# Patient Record
Sex: Female | Born: 1980 | Race: White | Hispanic: No | Marital: Married | Smoking: Former smoker
Health system: Southern US, Community
[De-identification: ages and names within clinical notes are randomized; demographics above are authoritative.]

## PROBLEM LIST (undated history)

## (undated) ENCOUNTER — Inpatient Hospital Stay (HOSPITAL_COMMUNITY): Payer: Self-pay

## (undated) DIAGNOSIS — K219 Gastro-esophageal reflux disease without esophagitis: Secondary | ICD-10-CM

## (undated) DIAGNOSIS — R51 Headache: Secondary | ICD-10-CM

## (undated) DIAGNOSIS — M797 Fibromyalgia: Secondary | ICD-10-CM

## (undated) DIAGNOSIS — K8020 Calculus of gallbladder without cholecystitis without obstruction: Secondary | ICD-10-CM

## (undated) DIAGNOSIS — J45909 Unspecified asthma, uncomplicated: Secondary | ICD-10-CM

## (undated) DIAGNOSIS — G43909 Migraine, unspecified, not intractable, without status migrainosus: Secondary | ICD-10-CM

## (undated) DIAGNOSIS — F419 Anxiety disorder, unspecified: Secondary | ICD-10-CM

## (undated) DIAGNOSIS — F32A Depression, unspecified: Secondary | ICD-10-CM

## (undated) DIAGNOSIS — H4710 Unspecified papilledema: Secondary | ICD-10-CM

## (undated) DIAGNOSIS — R011 Cardiac murmur, unspecified: Secondary | ICD-10-CM

## (undated) DIAGNOSIS — J189 Pneumonia, unspecified organism: Secondary | ICD-10-CM

## (undated) DIAGNOSIS — Z8659 Personal history of other mental and behavioral disorders: Secondary | ICD-10-CM

## (undated) DIAGNOSIS — D649 Anemia, unspecified: Secondary | ICD-10-CM

## (undated) DIAGNOSIS — G932 Benign intracranial hypertension: Secondary | ICD-10-CM

## (undated) DIAGNOSIS — Z8669 Personal history of other diseases of the nervous system and sense organs: Secondary | ICD-10-CM

## (undated) DIAGNOSIS — F329 Major depressive disorder, single episode, unspecified: Secondary | ICD-10-CM

## (undated) HISTORY — DX: Migraine, unspecified, not intractable, without status migrainosus: G43.909

## (undated) HISTORY — DX: Unspecified papilledema: H47.10

## (undated) HISTORY — PX: UPPER GASTROINTESTINAL ENDOSCOPY: SHX188

## (undated) HISTORY — PX: ANKLE RECONSTRUCTION: SHX1151

---

## 2010-06-11 ENCOUNTER — Emergency Department (HOSPITAL_COMMUNITY): Admission: EM | Admit: 2010-06-11 | Discharge: 2010-06-11 | Payer: Self-pay | Admitting: Emergency Medicine

## 2011-04-03 ENCOUNTER — Emergency Department (HOSPITAL_COMMUNITY)
Admission: EM | Admit: 2011-04-03 | Discharge: 2011-04-04 | Disposition: A | Discharge status: Home / Self Care | Payer: BC Managed Care – PPO | Attending: Emergency Medicine | Admitting: Emergency Medicine

## 2011-04-03 DIAGNOSIS — L509 Urticaria, unspecified: Secondary | ICD-10-CM | POA: Insufficient documentation

## 2011-04-03 DIAGNOSIS — B999 Unspecified infectious disease: Secondary | ICD-10-CM | POA: Insufficient documentation

## 2011-04-03 DIAGNOSIS — I889 Nonspecific lymphadenitis, unspecified: Secondary | ICD-10-CM | POA: Insufficient documentation

## 2011-04-03 DIAGNOSIS — M542 Cervicalgia: Secondary | ICD-10-CM | POA: Insufficient documentation

## 2011-04-04 ENCOUNTER — Encounter (HOSPITAL_COMMUNITY): Payer: Self-pay

## 2011-04-04 ENCOUNTER — Emergency Department (HOSPITAL_COMMUNITY): Payer: BC Managed Care – PPO

## 2011-04-04 MED ORDER — IOHEXOL 300 MG/ML  SOLN
100.0000 mL | Freq: Once | INTRAMUSCULAR | Status: AC | PRN
  Administered 2011-04-04: 100 mL via INTRAVENOUS

## 2011-07-11 LAB — OB RESULTS CONSOLE HEPATITIS B SURFACE ANTIGEN: Hepatitis B Surface Ag: NEGATIVE

## 2011-07-11 LAB — OB RESULTS CONSOLE GC/CHLAMYDIA
Chlamydia: NEGATIVE
Gonorrhea: NEGATIVE

## 2011-07-11 LAB — OB RESULTS CONSOLE RUBELLA ANTIBODY, IGM: Rubella: IMMUNE

## 2011-07-11 LAB — OB RESULTS CONSOLE ANTIBODY SCREEN: Antibody Screen: NEGATIVE

## 2011-07-11 LAB — OB RESULTS CONSOLE HIV ANTIBODY (ROUTINE TESTING): HIV: NONREACTIVE
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
## 2012-01-28 ENCOUNTER — Emergency Department (HOSPITAL_COMMUNITY): Payer: BC Managed Care – PPO

## 2012-01-28 ENCOUNTER — Inpatient Hospital Stay (HOSPITAL_COMMUNITY)
Admission: EM | Admit: 2012-01-28 | Discharge: 2012-01-28 | Disposition: A | Discharge status: Home / Self Care | Payer: BC Managed Care – PPO | Source: Ambulatory Visit | Attending: Obstetrics and Gynecology | Admitting: Obstetrics and Gynecology

## 2012-01-28 ENCOUNTER — Inpatient Hospital Stay (HOSPITAL_COMMUNITY): Payer: BC Managed Care – PPO

## 2012-01-28 ENCOUNTER — Encounter (HOSPITAL_COMMUNITY): Payer: Self-pay | Admitting: *Deleted

## 2012-01-28 DIAGNOSIS — M79609 Pain in unspecified limb: Secondary | ICD-10-CM | POA: Insufficient documentation

## 2012-01-28 DIAGNOSIS — Y92410 Unspecified street and highway as the place of occurrence of the external cause: Secondary | ICD-10-CM | POA: Insufficient documentation

## 2012-01-28 DIAGNOSIS — Z3490 Encounter for supervision of normal pregnancy, unspecified, unspecified trimester: Secondary | ICD-10-CM

## 2012-01-28 DIAGNOSIS — O99891 Other specified diseases and conditions complicating pregnancy: Secondary | ICD-10-CM | POA: Insufficient documentation

## 2012-01-28 DIAGNOSIS — M25531 Pain in right wrist: Secondary | ICD-10-CM

## 2012-01-28 DIAGNOSIS — S63509A Unspecified sprain of unspecified wrist, initial encounter: Secondary | ICD-10-CM

## 2012-01-28 HISTORY — DX: Unspecified asthma, uncomplicated: J45.909

## 2012-01-28 MED ORDER — OXYCODONE-ACETAMINOPHEN 5-325 MG PO TABS: 1.0000 | ORAL_TABLET | ORAL | Status: AC | PRN
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
## 2012-01-28 NOTE — Progress Notes (Addendum)
At pt bedside. Pt reports no abd pain, positive fetal movement, reports pants to be damp. [facility] 02/25/12 and PNC with Dr. Tenny Craw. Pt states being G3P0 with no complications with pregnancy. No abd tenderness with palpation or patient complaint.

## 2012-01-28 NOTE — ED Provider Notes (Cosign Needed)
  I performed a history and physical examination of Dana Frost and discussed her management with Dr. Leary Roca.  I agree with the history, physical, assessment, and plan of care, with the following exceptions: None  I was present for the following procedures: None Time Spent in Critical Care of the patient: None Time spent in discussions with the patient and family: 10  Magie Ciampa Corlis Leak, MD 01/28/12 1558  Patient seen by ob rapid response and patient being transferred to women's for further monitoring.   Hilario Quarry, MD 01/28/12 [phone]

## 2012-01-28 NOTE — Progress Notes (Signed)
Report given to MAU regarding pt being transferred after being cleared medically.

## 2012-01-28 NOTE — Progress Notes (Signed)
Updated on pt's ultrasound and FHR tracing, contraction pattern
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
## 2012-01-28 NOTE — Progress Notes (Signed)
Dr. Dareen Piano called and notified of pt and FHR tracing with UCs every 2-5 min. Stated pt needed OB complete US. Which would need to be done at [facility]. So transfer was accepted once pt cleared medically.

## 2012-01-28 NOTE — Progress Notes (Signed)
EFM removed for transport to [facility] via EMS
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
## 2012-01-29 ENCOUNTER — Encounter (HOSPITAL_COMMUNITY): Payer: Self-pay | Admitting: Advanced Practice Midwife

## 2012-02-01 NOTE — ED Provider Notes (Signed)
See my note   Hilario Quarry, MD 02/01/12 [phone]

## 2012-02-09 ENCOUNTER — Inpatient Hospital Stay (HOSPITAL_COMMUNITY)
Admission: AD | Admit: 2012-02-09 | Discharge: 2012-02-11 | DRG: 373 | Disposition: A | Discharge status: Home / Self Care | Payer: BC Managed Care – PPO | Source: Ambulatory Visit | Attending: Obstetrics and Gynecology | Admitting: Obstetrics and Gynecology

## 2012-02-09 ENCOUNTER — Encounter (HOSPITAL_COMMUNITY): Payer: Self-pay | Admitting: Obstetrics and Gynecology

## 2012-02-09 DIAGNOSIS — Z8669 Personal history of other diseases of the nervous system and sense organs: Secondary | ICD-10-CM | POA: Insufficient documentation

## 2012-02-09 DIAGNOSIS — Z3490 Encounter for supervision of normal pregnancy, unspecified, unspecified trimester: Secondary | ICD-10-CM

## 2012-02-09 HISTORY — DX: Personal history of other diseases of the nervous system and sense organs: Z86.69

## 2012-02-09 LAB — CBC
Hemoglobin: 12.4 g/dL (ref 12.0–15.0)
Platelets: 236 10*3/uL (ref 150–400)
RBC: 4.22 MIL/uL (ref 3.87–5.11)
WBC: 8.6 10*3/uL (ref 4.0–10.5)

## 2012-02-09 MED ORDER — ONDANSETRON HCL 4 MG/2ML IJ SOLN: 4.0000 mg | Freq: Four times a day (QID) | INTRAMUSCULAR | Status: DC | PRN

## 2012-02-09 MED ORDER — OXYTOCIN BOLUS FROM INFUSION
250.0000 mL | Freq: Once | INTRAVENOUS | Status: DC
  Filled 2012-02-09: qty 500

## 2012-02-09 MED ORDER — TERBUTALINE SULFATE 1 MG/ML IJ SOLN: 0.2500 mg | Freq: Once | INTRAMUSCULAR | Status: AC | PRN

## 2012-02-09 MED ORDER — OXYCODONE-ACETAMINOPHEN 5-325 MG PO TABS: 1.0000 | ORAL_TABLET | ORAL | Status: DC | PRN

## 2012-02-09 MED ORDER — LACTATED RINGERS IV SOLN: 500.0000 mL | INTRAVENOUS | Status: DC | PRN

## 2012-02-09 MED ORDER — FLEET ENEMA 7-19 GM/118ML RE ENEM: 1.0000 | ENEMA | RECTAL | Status: DC | PRN

## 2012-02-09 MED ORDER — IBUPROFEN 600 MG PO TABS
600.0000 mg | ORAL_TABLET | Freq: Four times a day (QID) | ORAL | Status: DC | PRN
  Administered 2012-02-10: 600 mg via ORAL
  Filled 2012-02-09: qty 1

## 2012-02-09 MED ORDER — OXYTOCIN 40 UNITS IN LACTATED RINGERS INFUSION - SIMPLE MED
1.0000 m[IU]/min | INTRAVENOUS | Status: DC
  Administered 2012-02-09: 2 m[IU]/min via INTRAVENOUS
  Filled 2012-02-09: qty 1000

## 2012-02-09 MED ORDER — ACETAMINOPHEN 325 MG PO TABS: 650.0000 mg | ORAL_TABLET | ORAL | Status: DC | PRN

## 2012-02-09 MED ORDER — LACTATED RINGERS IV SOLN
INTRAVENOUS | Status: DC
  Administered 2012-02-09 – 2012-02-10 (×2): via INTRAVENOUS

## 2012-02-09 MED ORDER — OXYTOCIN 40 UNITS IN LACTATED RINGERS INFUSION - SIMPLE MED
62.5000 mL/h | Freq: Once | INTRAVENOUS | Status: AC
  Administered 2012-02-10: 62.5 mL/h via INTRAVENOUS

## 2012-02-09 MED ORDER — CITRIC ACID-SODIUM CITRATE 334-500 MG/5ML PO SOLN
30.0000 mL | ORAL | Status: DC | PRN
  Administered 2012-02-10: 30 mL via ORAL
  Filled 2012-02-09: qty 15

## 2012-02-09 MED ORDER — LIDOCAINE HCL (PF) 1 % IJ SOLN
30.0000 mL | INTRAMUSCULAR | Status: DC | PRN
  Filled 2012-02-09: qty 30

## 2012-02-09 NOTE — Progress Notes (Signed)
Notified Dr. Claiborne Billings that patient is grossly ruptured. No beds available on L/D- Dr. Claiborne Billings requested NP to do speculum exam due to patients hx of HSV. Kerrie Buffalo NP notified.

## 2012-02-09 NOTE — H&P (Signed)
31 y.o. [redacted]w[redacted]d  G3P0020 comes in c/o SROM about 1:30 pm.  Otherwise has good fetal movement and no bleeding.  No other complaints.  Past Medical History  Diagnosis Date  . Asthma   . HSV (herpes simplex virus) infection     Past Surgical History  Procedure Date  . Upper gastrointestinal endoscopy   . Ankle reconstruction     OB History    Grav Para Term Preterm Abortions TAB SAB Ect Mult Living   3 0 0 0 2 1 1 0 0 0      # Outc Date GA Lbr Len/2nd Wgt Sex Del Anes PTL Lv   1 TAB            2 SAB            3 CUR               History   Social History  . Marital Status: Single    Spouse Name: N/A    Number of Children: N/A  . Years of Education: N/A   Occupational History  . Not on file.   Social History Main Topics  . Smoking status: Former Games developer  . Smokeless tobacco: Not on file  . Alcohol Use: No  . Drug Use: No  . Sexually Active: Not Currently   Other Topics Concern  . Not on file   Social History Narrative  . No narrative on file   Penicillins   Prenatal Course:  HSV 2 - no outbreaks this pregnancy, on Valtrex prophylaxis, exam neg for lesions per MAU  Filed Vitals:   02/09/12 1753  BP: 126/58  Pulse: 88  Temp: 98 F (36.7 C)  Resp: 18     Lungs/Cor:  NAD Abdomen:  soft, gravid Ex:  no cords, erythema SVE:  1/100/-2 FHTs:  120, good STV, NST R Toco:  q1-5   A/P   Admit with SROM  GBS Neg  Recheck 2 hrs, if no change begin pitocin 2x2  Routine orders. Philip Aspen

## 2012-02-09 NOTE — MAU Provider Note (Signed)
Dana Frost is a 31 y.o. female @ [redacted]w[redacted]d gestation who presents to MAU for ROM and contractions.   BP 126/58  Pulse 88  Temp 98 F (36.7 C) (Oral)  Resp 18  Ht 5\' 7"  (1.702 m)  Wt 229 lb (103.874 kg)  BMI 35.87 kg/m2  LMP 03/29/2011   Pelvic exam: external genitalia without lesions, clear watery pooling noted vaginal vault.  Procedures: Slide obtained for RN to do microscopic exam for ferning. She will call the patient's OB with results of slide and to give update on EFM tracing.   Slide is fern positive.

## 2012-02-09 NOTE — Progress Notes (Signed)
Provider made aware of pt status: uterine contraction pattern, FHT tracing and pain level. POC discussed. Will continue to monitor.

## 2012-02-09 NOTE — MAU Note (Signed)
Started leaking fluid around 1:30 8 gushes and a very large gush, patient is wet at present, [redacted]w[redacted]d G1

## 2012-02-10 ENCOUNTER — Encounter (HOSPITAL_COMMUNITY): Payer: Self-pay | Admitting: Anesthesiology

## 2012-02-10 ENCOUNTER — Encounter (HOSPITAL_COMMUNITY): Payer: Self-pay

## 2012-02-10 ENCOUNTER — Inpatient Hospital Stay (HOSPITAL_COMMUNITY): Payer: BC Managed Care – PPO | Admitting: Anesthesiology

## 2012-02-10 LAB — RPR: RPR Ser Ql: NONREACTIVE

## 2012-02-10 LAB — TYPE AND SCREEN: Antibody Screen: NEGATIVE

## 2012-02-10 LAB — ABO/RH: ABO/RH(D): O POS

## 2012-02-10 MED ORDER — SENNOSIDES-DOCUSATE SODIUM 8.6-50 MG PO TABS: 2.0000 | ORAL_TABLET | Freq: Every day | ORAL | Status: DC

## 2012-02-10 MED ORDER — IBUPROFEN 600 MG PO TABS
600.0000 mg | ORAL_TABLET | Freq: Four times a day (QID) | ORAL | Status: DC
  Administered 2012-02-10 – 2012-02-11 (×4): 600 mg via ORAL
  Filled 2012-02-10 (×4): qty 1

## 2012-02-10 MED ORDER — LANOLIN HYDROUS EX OINT: TOPICAL_OINTMENT | CUTANEOUS | Status: DC | PRN

## 2012-02-10 MED ORDER — EPHEDRINE 5 MG/ML INJ: 10.0000 mg | INTRAVENOUS | Status: DC | PRN

## 2012-02-10 MED ORDER — DIBUCAINE 1 % RE OINT: 1.0000 "application " | TOPICAL_OINTMENT | RECTAL | Status: DC | PRN

## 2012-02-10 MED ORDER — PRENATAL MULTIVITAMIN CH
1.0000 | ORAL_TABLET | Freq: Every day | ORAL | Status: DC
  Administered 2012-02-11: 1 via ORAL
  Filled 2012-02-10: qty 1

## 2012-02-10 MED ORDER — BENZOCAINE-MENTHOL 20-0.5 % EX AERO
1.0000 "application " | INHALATION_SPRAY | CUTANEOUS | Status: DC | PRN
  Administered 2012-02-10: 1 via TOPICAL
  Filled 2012-02-10: qty 56

## 2012-02-10 MED ORDER — PRENATAL MULTIVITAMIN CH: 1.0000 | ORAL_TABLET | Freq: Every day | ORAL | Status: DC

## 2012-02-10 MED ORDER — LIDOCAINE HCL (PF) 1 % IJ SOLN
INTRAMUSCULAR | Status: DC | PRN
  Administered 2012-02-10 (×2): 9 mL

## 2012-02-10 MED ORDER — OXYCODONE-ACETAMINOPHEN 5-325 MG PO TABS: 1.0000 | ORAL_TABLET | ORAL | Status: DC | PRN

## 2012-02-10 MED ORDER — PHENYLEPHRINE 40 MCG/ML (10ML) SYRINGE FOR IV PUSH (FOR BLOOD PRESSURE SUPPORT)
80.0000 ug | PREFILLED_SYRINGE | INTRAVENOUS | Status: DC | PRN
  Filled 2012-02-10: qty 5

## 2012-02-10 MED ORDER — ZOLPIDEM TARTRATE 5 MG PO TABS: 5.0000 mg | ORAL_TABLET | Freq: Every evening | ORAL | Status: DC | PRN

## 2012-02-10 MED ORDER — DIPHENHYDRAMINE HCL 50 MG/ML IJ SOLN: 12.5000 mg | INTRAMUSCULAR | Status: DC | PRN

## 2012-02-10 MED ORDER — PHENYLEPHRINE 40 MCG/ML (10ML) SYRINGE FOR IV PUSH (FOR BLOOD PRESSURE SUPPORT): 80.0000 ug | PREFILLED_SYRINGE | INTRAVENOUS | Status: DC | PRN

## 2012-02-10 MED ORDER — LACTATED RINGERS IV SOLN
500.0000 mL | Freq: Once | INTRAVENOUS | Status: AC
  Administered 2012-02-10: 03:00:00 via INTRAVENOUS

## 2012-02-10 MED ORDER — ONDANSETRON HCL 4 MG PO TABS: 4.0000 mg | ORAL_TABLET | ORAL | Status: DC | PRN

## 2012-02-10 MED ORDER — SIMETHICONE 80 MG PO CHEW: 80.0000 mg | CHEWABLE_TABLET | ORAL | Status: DC | PRN

## 2012-02-10 MED ORDER — TETANUS-DIPHTH-ACELL PERTUSSIS 5-2.5-18.5 LF-MCG/0.5 IM SUSP
0.5000 mL | Freq: Once | INTRAMUSCULAR | Status: AC
  Administered 2012-02-11: 0.5 mL via INTRAMUSCULAR
  Filled 2012-02-10: qty 0.5

## 2012-02-10 MED ORDER — EPHEDRINE 5 MG/ML INJ
10.0000 mg | INTRAVENOUS | Status: DC | PRN
  Administered 2012-02-10: 10 mg via INTRAVENOUS
  Filled 2012-02-10: qty 4

## 2012-02-10 MED ORDER — FENTANYL 2.5 MCG/ML BUPIVACAINE 1/10 % EPIDURAL INFUSION (WH - ANES)
14.0000 mL/h | INTRAMUSCULAR | Status: DC
  Administered 2012-02-10 (×2): 14 mL/h via EPIDURAL
  Filled 2012-02-10 (×3): qty 60

## 2012-02-10 MED ORDER — LACTATED RINGERS IV SOLN: INTRAVENOUS | Status: DC

## 2012-02-10 MED ORDER — FENTANYL 2.5 MCG/ML BUPIVACAINE 1/10 % EPIDURAL INFUSION (WH - ANES)
INTRAMUSCULAR | Status: DC | PRN
  Administered 2012-02-10: 13 mL/h via EPIDURAL

## 2012-02-10 MED ORDER — WITCH HAZEL-GLYCERIN EX PADS: 1.0000 "application " | MEDICATED_PAD | CUTANEOUS | Status: DC | PRN

## 2012-02-10 MED ORDER — DIPHENHYDRAMINE HCL 25 MG PO CAPS: 25.0000 mg | ORAL_CAPSULE | Freq: Four times a day (QID) | ORAL | Status: DC | PRN

## 2012-02-10 MED ORDER — ONDANSETRON HCL 4 MG/2ML IJ SOLN: 4.0000 mg | INTRAMUSCULAR | Status: DC | PRN

## 2012-02-10 NOTE — Anesthesia Preprocedure Evaluation (Signed)
Anesthesia Evaluation  Patient identified by MRN, date of birth, ID band Patient awake    Reviewed: Allergy & Precautions, H&P , NPO status , Patient's Chart, lab work & pertinent test results  Airway Mallampati: II TM Distance: >3 FB Neck ROM: full    Dental No notable dental hx.    Pulmonary    Pulmonary exam normal       Cardiovascular negative cardio ROS      Neuro/Psych negative neurological ROS  negative psych ROS   GI/Hepatic negative GI ROS, Neg liver ROS,   Endo/Other  Morbid obesity  Renal/GU negative Renal ROS  negative genitourinary   Musculoskeletal negative musculoskeletal ROS (+)   Abdominal (+) + obese,   Peds negative pediatric ROS (+)  Hematology negative hematology ROS (+)   Anesthesia Other Findings   Reproductive/Obstetrics (+) Pregnancy                           Anesthesia Physical Anesthesia Plan  ASA: III  Anesthesia Plan: Epidural   Post-op Pain Management:    Induction:   Airway Management Planned:   Additional Equipment:   Intra-op Plan:   Post-operative Plan:   Informed Consent: I have reviewed the patients History and Physical, chart, labs and discussed the procedure including the risks, benefits and alternatives for the proposed anesthesia with the patient or authorized representative who has indicated his/her understanding and acceptance.     Plan Discussed with:   Anesthesia Plan Comments:         Anesthesia Quick Evaluation  

## 2012-02-10 NOTE — Progress Notes (Signed)
Delivery of live viable female by Dr Callahan. APGARs 9,9 

## 2012-02-10 NOTE — Anesthesia Procedure Notes (Signed)
Epidural Patient location during procedure: OB Start time: 02/10/2012 3:12 AM End time: 02/10/2012 3:17 AM Reason for block: procedure for pain  Staffing Anesthesiologist: Sandrea Hughs Performed by: anesthesiologist   Preanesthetic Checklist Completed: patient identified, site marked, surgical consent, pre-op evaluation, timeout performed, IV checked, risks and benefits discussed and monitors and equipment checked  Epidural Patient position: sitting Prep: site prepped and draped and DuraPrep Patient monitoring: continuous pulse ox and blood pressure Approach: midline Injection technique: LOR air  Needle:  Needle type: Tuohy  Needle gauge: 17 G Needle length: 9 cm Needle insertion depth: 5 cm cm Catheter type: closed end flexible Catheter size: 19 Gauge Catheter at skin depth: 10 cm Test dose: negative and Other  Assessment Sensory level: T8 Events: blood not aspirated, injection not painful, no injection resistance, negative IV test and no paresthesia

## 2012-02-10 NOTE — Anesthesia Postprocedure Evaluation (Signed)
  Anesthesia Post-op Note  Patient: Dana Frost  Procedure(s) Performed: * No procedures listed *  Patient Location: PACU and Mother/Baby  Anesthesia Type: Epidural  Level of Consciousness: awake, alert  and oriented  Airway and Oxygen Therapy: Patient Spontanous Breathing   Post-op Assessment: Patient's Cardiovascular Status Stable and Respiratory Function Stable  Post-op Vital Signs: stable  Complications: No apparent anesthesia complications

## 2012-02-11 LAB — CBC
Hemoglobin: 9.4 g/dL — ABNORMAL LOW (ref 12.0–15.0)
MCHC: 33.9 g/dL (ref 30.0–36.0)
RDW: 12.6 % (ref 11.5–15.5)

## 2012-02-11 NOTE — Discharge Summary (Signed)
Obstetric Discharge Summary Reason for Admission: onset of labor and rupture of membranes Prenatal Procedures: ultrasound Intrapartum Procedures: spontaneous vaginal delivery Postpartum Procedures: none Complications-Operative and Postpartum: 1st degree vaginal laceration Hemoglobin  Date Value Range Status  02/11/2012 9.4* 12.0 - 15.0 g/dL Final     DELTA CHECK NOTED     REPEATED TO VERIFY     HCT  Date Value Range Status  02/11/2012 27.7* 36.0 - 46.0 % Final    Physical Exam:  General: alert Lochia: appropriate Uterine Fundus: firm  Discharge Diagnoses: Term Pregnancy-delivered  Discharge Information: Date: 02/11/2012 Activity: pelvic rest Diet: routine Medications: PNV and Ibuprofen Condition: improved Instructions: refer to practice specific booklet Discharge to: home Follow-up Information    Follow up with CALLAHAN, SIDNEY, DO in 4 weeks.   Contact information:   [address], [address] [facility] [place] [address] [phone]          Newborn Data: Live born female  Birth Weight: 7 lb 6 oz (3345 g) APGAR: 9, 9  Home with mother.  Dana Frost 02/11/2012, 9:21 AM

## 2013-07-30 NOTE — L&D Delivery Note (Signed)
Patient was C/C/+4 and pushed for 1 minutes with epidural.   NSVD  female infant, Apgars 9,9, weight P.   The patient had no lacerations. Fundus was firm. EBL was expected. Placenta was delivered intact. Vagina was clear.  Baby was vigorous and doing skin to skin with mother.  Makaylee Spielberg A

## 2013-11-26 ENCOUNTER — Emergency Department (HOSPITAL_COMMUNITY)
Admission: EM | Admit: 2013-11-26 | Discharge: 2013-11-26 | Disposition: A | Discharge status: Home / Self Care | Payer: 59 | Attending: Emergency Medicine | Admitting: Emergency Medicine

## 2013-11-26 ENCOUNTER — Emergency Department (HOSPITAL_COMMUNITY): Payer: 59

## 2013-11-26 ENCOUNTER — Encounter (HOSPITAL_COMMUNITY): Payer: Self-pay | Admitting: Emergency Medicine

## 2013-11-26 DIAGNOSIS — Z79899 Other long term (current) drug therapy: Secondary | ICD-10-CM | POA: Insufficient documentation

## 2013-11-26 DIAGNOSIS — S93609A Unspecified sprain of unspecified foot, initial encounter: Secondary | ICD-10-CM | POA: Insufficient documentation

## 2013-11-26 DIAGNOSIS — O9989 Other specified diseases and conditions complicating pregnancy, childbirth and the puerperium: Secondary | ICD-10-CM | POA: Insufficient documentation

## 2013-11-26 DIAGNOSIS — Z8619 Personal history of other infectious and parasitic diseases: Secondary | ICD-10-CM | POA: Insufficient documentation

## 2013-11-26 DIAGNOSIS — Z880 Allergy status to penicillin: Secondary | ICD-10-CM | POA: Insufficient documentation

## 2013-11-26 DIAGNOSIS — J45909 Unspecified asthma, uncomplicated: Secondary | ICD-10-CM | POA: Insufficient documentation

## 2013-11-26 DIAGNOSIS — Z87891 Personal history of nicotine dependence: Secondary | ICD-10-CM | POA: Insufficient documentation

## 2013-11-26 DIAGNOSIS — S93601A Unspecified sprain of right foot, initial encounter: Secondary | ICD-10-CM

## 2013-11-26 DIAGNOSIS — Y9289 Other specified places as the place of occurrence of the external cause: Secondary | ICD-10-CM | POA: Insufficient documentation

## 2013-11-26 DIAGNOSIS — H9190 Unspecified hearing loss, unspecified ear: Secondary | ICD-10-CM | POA: Insufficient documentation

## 2013-11-26 DIAGNOSIS — X500XXA Overexertion from strenuous movement or load, initial encounter: Secondary | ICD-10-CM | POA: Insufficient documentation

## 2013-11-26 DIAGNOSIS — Y939 Activity, unspecified: Secondary | ICD-10-CM | POA: Insufficient documentation

## 2013-11-26 MED ORDER — ACETAMINOPHEN 325 MG PO TABS
650.0000 mg | ORAL_TABLET | Freq: Once | ORAL | Status: AC
  Administered 2013-11-26: 650 mg via ORAL
  Filled 2013-11-26: qty 2

## 2013-11-26 NOTE — ED Provider Notes (Signed)
CSN: [id number]     Arrival date & time 11/26/13  0801 History   First MD Initiated Contact with Patient 11/26/13 0805     Chief Complaint  Patient presents with  . Foot Pain  . Ankle Pain     (Consider location/radiation/quality/duration/timing/severity/associated sxs/prior Treatment) HPI Patient is a [age] year old female [redacted] weeks pregnant, complaining of right foot pain after twisting ankle in parking lot just prior to arrival. Reports hearing a crack in her foot. Patient complains of constant aching right foot 6/10 pain.  Pain is worse with weightbearing and ambulation.  Denies other injuries. Denies taking pain medication prior to her arrival.  Past Medical History  Diagnosis Date  . Asthma   . HSV (herpes simplex virus) infection   . H/O hearing loss     90%left, 10%right   Past Surgical History  Procedure Laterality Date  . Upper gastrointestinal endoscopy    . Ankle reconstruction     No family history on file. History  Substance Use Topics  . Smoking status: Former Games developermoker  . Smokeless tobacco: Not on file  . Alcohol Use: No   OB History   Grav Para Term Preterm Abortions TAB SAB Ect Mult Living   4 1 1  0 2 1 1  0 0 1     Review of Systems  Musculoskeletal: Positive for arthralgias and myalgias. Negative for joint swelling.       Right foot  Skin: Negative for color change and wound.  Neurological: Negative for weakness and numbness.  All other systems reviewed and are negative.     Allergies  Penicillins  Home Medications   Prior to Admission medications   Medication Sig Start Date End Date Taking? Authorizing Provider  calcium carbonate (TUMS - DOSED IN MG ELEMENTAL CALCIUM) 500 MG chewable tablet Chew 1 tablet by mouth daily. For heartburn    Historical Provider, MD  diphenhydrAMINE (BENADRYL) 25 MG tablet Take 25 mg by mouth at bedtime as needed. Itching    Historical Provider, MD  guaiFENesin (ROBITUSSIN) 100 MG/5ML SOLN Take 10 mLs by mouth every 4  (four) hours as needed. For cough and cold symptoms    Historical Provider, MD  Prenatal Vit-Fe Fumarate-FA (PRENATAL MULTIVITAMIN) TABS Take 1 tablet by mouth daily.    Historical Provider, MD   BP 123/77  Pulse 104  Temp(Src) 98.2 F (36.8 C) (Oral)  Resp 18  Ht 5\' 7"  (1.702 m)  Wt 238 lb (107.956 kg)  BMI 37.27 kg/m2  SpO2 97% Physical Exam  Nursing note and vitals reviewed. Constitutional: She is oriented to person, place, and time. She appears well-developed and well-nourished.  HENT:  Head: Normocephalic and atraumatic.  Eyes: EOM are normal.  Neck: Normal range of motion.  Cardiovascular: Normal rate.   Pulses:      Dorsalis pedis pulses are 2+ on the right side.       Posterior tibial pulses are 2+ on the right side.  Pulmonary/Chest: Effort normal.  Musculoskeletal: She exhibits tenderness. She exhibits no edema.  Tenderness to dorsal and lateral aspect of right foot. No edema or ecchymosis. Decreased ROM due to pain.  FROM all 5 toes.   Neurological: She is alert and oriented to person, place, and time.  Sensation to light and sharp touch in tact  Skin: Skin is warm and dry.  Skin in tact. No ecchymosis, erythema, or warmth. No red streaking, induration, or evidence of underlying infection.  Psychiatric: She has a normal mood and affect. Her  behavior is normal.    ED Course  Procedures (including critical care time) Labs Review Labs Reviewed - No data to display  Imaging Review Dg Ankle Complete Right  11/26/2013   CLINICAL DATA:  Injury, lateral pain  EXAM: RIGHT ANKLE - COMPLETE 3+ VIEW  COMPARISON:  11/26/2013  FINDINGS: There is no evidence of fracture, dislocation, or joint effusion. There is no evidence of arthropathy or other focal bone abnormality. Soft tissues are unremarkable.  IMPRESSION: No acute osseous finding   Electronically Signed   By: Ruel Favorsrevor  Shick M.D.   On: 11/26/2013 08:46   Dg Foot Complete Right  11/26/2013   CLINICAL DATA:  Fall, injury,  pain  EXAM: RIGHT FOOT COMPLETE - 3+ VIEW  COMPARISON:  None.  FINDINGS: There is no evidence of fracture or dislocation. There is no evidence of arthropathy or other focal bone abnormality. Soft tissues are unremarkable.  IMPRESSION: No acute osseous finding   Electronically Signed   By: Ruel Favorsrevor  Shick M.D.   On: 11/26/2013 08:49     EKG Interpretation None      MDM   Final diagnoses:  Right foot sprain    Patient complaining of right pain. Right foot is neurovascularly intact. Plain films of the foot and ankle negative for fracture or other acute findings. Will treat as sprained foot.  We'll provide Ace wrap and crutches. Patient may take acetaminophen discussed RICE treatment. Provided patient information packet. Advise followup with family doctor as well as orthopedics in one to 2 weeks if symptoms not improving. Patient verbalized understanding and agreement with treatment plan    Junius Finnerrin O'Malley, PA-C 11/26/13 [id number]

## 2013-11-26 NOTE — ED Provider Notes (Signed)
Medical screening examination/treatment/procedure(s) were performed by non-physician practitioner and as supervising physician I was immediately available for consultation/collaboration.   EKG Interpretation None        Glynn OctaveStephen Alycen Mack, MD 11/26/13 1535

## 2013-11-26 NOTE — ED Notes (Addendum)
Patient states she tripped on a hole in a parking lot and rolled her foot and ankle.  Patient described as "I heard cracking".   Patient advised she is [redacted] weeks pregnant.

## 2013-11-26 NOTE — Discharge Instructions (Signed)
Foot Sprain The muscles and cord like structures which attach muscle to bone (tendons) that surround the feet are made up of units. A foot sprain can occur at the weakest spot in any of these units. This condition is most often caused by injury to or overuse of the foot, as from playing contact sports, or aggravating a previous injury, or from poor conditioning, or obesity. SYMPTOMS  Pain with movement of the foot.  Tenderness and swelling at the injury site.  Loss of strength is present in moderate or severe sprains. THE THREE GRADES OR SEVERITY OF FOOT SPRAIN ARE:  Mild (Grade I): Slightly pulled muscle without tearing of muscle or tendon fibers or loss of strength.  Moderate (Grade II): Tearing of fibers in a muscle, tendon, or at the attachment to bone, with small decrease in strength.  Severe (Grade III): Rupture of the muscle-tendon-bone attachment, with separation of fibers. Severe sprain requires surgical repair. Often repeating (chronic) sprains are caused by overuse. Sudden (acute) sprains are caused by direct injury or over-use. DIAGNOSIS  Diagnosis of this condition is usually by your own observation. If problems continue, a caregiver may be required for further evaluation and treatment. X-rays may be required to make sure there are not breaks in the bones (fractures) present. Continued problems may require physical therapy for treatment. PREVENTION  Use strength and conditioning exercises appropriate for your sport.  Warm up properly prior to working out.  Use athletic shoes that are made for the sport you are participating in.  Allow adequate time for healing. Early return to activities makes repeat injury more likely, and can lead to an unstable arthritic foot that can result in prolonged disability. Mild sprains generally heal in 3 to 10 days, with moderate and severe sprains taking 2 to 10 weeks. Your caregiver can help you determine the proper time required for  healing. HOME CARE INSTRUCTIONS   Apply ice to the injury for 15-20 minutes, 03-04 times per day. Put the ice in a plastic bag and place a towel between the bag of ice and your skin.  An elastic wrap (like an Ace bandage) may be used to keep swelling down.  Keep foot above the level of the heart, or at least raised on a footstool, when swelling and pain are present.  Try to avoid use other than gentle range of motion while the foot is painful. Do not resume use until instructed by your caregiver. Then begin use gradually, not increasing use to the point of pain. If pain does develop, decrease use and continue the above measures, gradually increasing activities that do not cause discomfort, until you gradually achieve normal use.  Use crutches if and as instructed, and for the length of time instructed.  Keep injured foot and ankle wrapped between treatments.  Massage foot and ankle for comfort and to keep swelling down. Massage from the toes up towards the knee.  Only take over-the-counter or prescription medicines for pain, discomfort, or fever as directed by your caregiver. SEEK IMMEDIATE MEDICAL CARE IF:   Your pain and swelling increase, or pain is not controlled with medications.  You have loss of feeling in your foot or your foot turns cold or blue.  You develop new, unexplained symptoms, or an increase of the symptoms that brought you to your caregiver. MAKE SURE YOU:   Understand these instructions.  Will watch your condition.  Will get help right away if you are not doing well or get worse. Document Released:   01/05/2002 Document Revised: 10/08/2011 Document Reviewed: 03/04/2008 Exit[employer] Patient Information 2014 [employer].  

## 2013-12-07 ENCOUNTER — Encounter (HOSPITAL_COMMUNITY): Payer: Self-pay

## 2013-12-07 ENCOUNTER — Inpatient Hospital Stay (HOSPITAL_COMMUNITY)
Admission: AD | Admit: 2013-12-07 | Discharge: 2013-12-07 | Disposition: A | Discharge status: Home / Self Care | Payer: 59 | Source: Ambulatory Visit | Attending: Obstetrics and Gynecology | Admitting: Obstetrics and Gynecology

## 2013-12-07 DIAGNOSIS — O9989 Other specified diseases and conditions complicating pregnancy, childbirth and the puerperium: Principal | ICD-10-CM

## 2013-12-07 DIAGNOSIS — R109 Unspecified abdominal pain: Secondary | ICD-10-CM | POA: Insufficient documentation

## 2013-12-07 DIAGNOSIS — O99891 Other specified diseases and conditions complicating pregnancy: Secondary | ICD-10-CM | POA: Insufficient documentation

## 2013-12-07 DIAGNOSIS — N949 Unspecified condition associated with female genital organs and menstrual cycle: Secondary | ICD-10-CM

## 2013-12-07 HISTORY — DX: Headache: R51

## 2013-12-07 LAB — URINALYSIS, ROUTINE W REFLEX MICROSCOPIC
BILIRUBIN URINE: NEGATIVE
Glucose, UA: NEGATIVE mg/dL
HGB URINE DIPSTICK: NEGATIVE
KETONES UR: NEGATIVE mg/dL
Leukocytes, UA: NEGATIVE
NITRITE: NEGATIVE
PH: 6 (ref 5.0–8.0)
Protein, ur: NEGATIVE mg/dL
Urobilinogen, UA: 0.2 mg/dL (ref 0.0–1.0)

## 2013-12-07 NOTE — MAU Note (Signed)
Patient states she has had intermittent left side pain for one week that radiates up side into left back and shoulder. Denies nausea or vomiting, bleeding or discharge.

## 2013-12-07 NOTE — MAU Provider Note (Signed)
Chief Complaint  Patient presents with  . Abdominal Pain    S: Addison Wynelle BourgeoisKeveryn is a 33 y.o. 7540w2d presenting with 1 wk hx of sharp crampy pain in lower abdomen, especially left groin. The pain is fairly continuous but waxes and wanes. It is exacerbated by walking and changing positions. It is relieved by rest. Using pelvic support binder without much help. Tried Flexeril prescribed by Dr. Mora ApplPinn 4 days ago for this pain, but it knocked her out and she did not like the feeling. No dysuria, urgency or frequency. She denies contractions, vaginal bleeding or leakage of fluid. Fetus is active.  Has 8022 month old.  ROS: Negative except as noted above.  Problem list, past medical history, Ob/Gyn history, surgical history, family history and social history reviewed.  Prenatal course: Essentially uncomplicated except low-lying placeneta  O: Filed Vitals:   12/07/13 1759  BP: 112/76  Pulse: 103  Temp: 98.8 F (37.1 C)  Resp: 20    Gen: WN WD female in NAD; gait normal Abd: soft, mildly tender in left groin, otherwise nontender throughout, S=D Back: negative CVAT VE: Cx L/C          FHR: DT 160   Results for orders placed during the hospital encounter of 12/07/13 (from the past 24 hour(s))  URINALYSIS, ROUTINE W REFLEX MICROSCOPIC     Status: Abnormal   Collection Time    12/07/13  6:00 PM      Result Value Ref Range   Color, Urine YELLOW  YELLOW   APPearance CLOUDY (*) CLEAR   Specific Gravity, Urine >1.030 (*) 1.005 - 1.030   pH 6.0  5.0 - 8.0   Glucose, UA NEGATIVE  NEGATIVE mg/dL   Hgb urine dipstick NEGATIVE  NEGATIVE   Bilirubin Urine NEGATIVE  NEGATIVE   Ketones, ur NEGATIVE  NEGATIVE mg/dL   Protein, ur NEGATIVE  NEGATIVE mg/dL   Urobilinogen, UA 0.2  0.0 - 1.0 mg/dL   Nitrite NEGATIVE  NEGATIVE   Leukocytes, UA NEGATIVE  NEGATIVE    MAU course:   A: [id number] at 6940w2d Round Ligament Pain  P: D/W Dr. Claiborne Billingsallahan Discharge with reassurance Abdominal tightening and back  strengthening exercise. Rest with left hip flexed. Continue support girdle. Acetaminophen 1000 mg tid prn.    Medication List         acetaminophen 500 MG tablet  Commonly known as:  TYLENOL  Take 1,000 mg by mouth every 6 (six) hours as needed for headache.     cyclobenzaprine 5 MG tablet  Commonly known as:  FLEXERIL  Take 5 mg by mouth 3 (three) times daily as needed for muscle spasms.     PRENATAL VITAMIN PO  Take 1 tablet by mouth daily.       Follow-up Information   [facility] Sanjuana MaeWALDA STACIA, MD. (Keep your scheduled prenatal appointment)    Specialty:  Obstetrics and Gynecology   Contact information:   [address] [place] [address] [phone]

## 2013-12-07 NOTE — Discharge Instructions (Signed)
Back Exercises Back exercises help treat and prevent back injuries. The goal of back exercises is to increase the strength of your abdominal and back muscles and the flexibility of your back. These exercises should be started when you no longer have back pain. Back exercises include:  Pelvic Tilt. Lie on your back with your knees bent. Tilt your pelvis until the lower part of your back is against the floor. Hold this position 5 to 10 sec and repeat 5 to 10 times.  Knee to Chest. Pull first 1 knee up against your chest and hold for 20 to 30 seconds, repeat this with the other knee, and then both knees. This may be done with the other leg straight or bent, whichever feels better.  Sit-Ups or Curl-Ups. Bend your knees 90 degrees. Start with tilting your pelvis, and do a partial, slow sit-up, lifting your trunk only 30 to 45 degrees off the floor. Take at least 2 to 3 seconds for each sit-up. Do not do sit-ups with your knees out straight. If partial sit-ups are difficult, simply do the above but with only tightening your abdominal muscles and holding it as directed.  Hip-Lift. Lie on your back with your knees flexed 90 degrees. Push down with your feet and shoulders as you raise your hips a couple inches off the floor; hold for 10 seconds, repeat 5 to 10 times.  Back arches. Lie on your stomach, propping yourself up on bent elbows. Slowly press on your hands, causing an arch in your low back. Repeat 3 to 5 times. Any initial stiffness and discomfort should lessen with repetition over time.  Shoulder-Lifts. Lie face down with arms beside your body. Keep hips and torso pressed to floor as you slowly lift your head and shoulders off the floor. Do not overdo your exercises, especially in the beginning. Exercises may cause you some mild back discomfort which lasts for a few minutes; however, if the pain is more severe, or lasts for more than 15 minutes, do not continue exercises until you see your caregiver.  Improvement with exercise therapy for back problems is slow.  See your caregivers for assistance with developing a proper back exercise program. Document Released: 08/23/2004 Document Revised: 10/08/2011 Document Reviewed: 05/17/2011 [facility] Patient Information 2014 [place], [place]. Abdominal Pain During Pregnancy Abdominal pain is common in pregnancy. Most of the time, it does not cause harm. There are many causes of abdominal pain. Some causes are more serious than others. Some of the causes of abdominal pain in pregnancy are easily diagnosed. Occasionally, the diagnosis takes time to understand. Other times, the cause is not determined. Abdominal pain can be a sign that something is very wrong with the pregnancy, or the pain may have nothing to do with the pregnancy at all. For this reason, always tell your health care provider if you have any abdominal discomfort. HOME CARE INSTRUCTIONS  Monitor your abdominal pain for any changes. The following actions may help to alleviate any discomfort you are experiencing:  Do not have sexual intercourse or put anything in your vagina until your symptoms go away completely.  Get plenty of rest until your pain improves.  Drink clear fluids if you feel nauseous. Avoid solid food as long as you are uncomfortable or nauseous.  Only take over-the-counter or prescription medicine as directed by your health care provider.  Keep all follow-up appointments with your health care provider. SEEK IMMEDIATE MEDICAL CARE IF:  You are bleeding, leaking fluid, or passing tissue from  the vagina.  You have increasing pain or cramping.  You have persistent vomiting.  You have painful or bloody urination.  You have a fever.  You notice a decrease in your baby's movements.  You have extreme weakness or feel faint.  You have shortness of breath, with or without abdominal pain.  You develop a severe headache with abdominal pain.  You have abnormal vaginal  discharge with abdominal pain.  You have persistent diarrhea.  You have abdominal pain that continues even after rest, or gets worse. MAKE SURE YOU:   Understand these instructions.  Will watch your condition.  Will get help right away if you are not doing well or get worse. Document Released: 07/16/2005 Document Revised: 05/06/2013 Document Reviewed: 02/12/2013 [facility] Patient Information 2014 [place], [place].

## 2014-04-03 ENCOUNTER — Encounter (HOSPITAL_COMMUNITY): Payer: Self-pay

## 2014-04-03 ENCOUNTER — Inpatient Hospital Stay (HOSPITAL_COMMUNITY)
Admission: AD | Admit: 2014-04-03 | Discharge: 2014-04-04 | Disposition: A | Discharge status: Home / Self Care | Payer: 59 | Source: Ambulatory Visit | Attending: Obstetrics and Gynecology | Admitting: Obstetrics and Gynecology

## 2014-04-03 ENCOUNTER — Inpatient Hospital Stay (HOSPITAL_COMMUNITY)
Admission: AD | Admit: 2014-04-03 | Discharge: 2014-04-03 | Disposition: A | Discharge status: Home / Self Care | Payer: Self-pay | Attending: Obstetrics and Gynecology | Admitting: Obstetrics and Gynecology

## 2014-04-03 DIAGNOSIS — O479 False labor, unspecified: Secondary | ICD-10-CM

## 2014-04-03 DIAGNOSIS — O4700 False labor before 37 completed weeks of gestation, unspecified trimester: Secondary | ICD-10-CM | POA: Insufficient documentation

## 2014-04-03 DIAGNOSIS — Z87891 Personal history of nicotine dependence: Secondary | ICD-10-CM | POA: Insufficient documentation

## 2014-04-03 HISTORY — DX: Personal history of other mental and behavioral disorders: Z86.59

## 2014-04-03 HISTORY — DX: Anxiety disorder, unspecified: F41.9

## 2014-04-03 HISTORY — DX: Depression, unspecified: F32.A

## 2014-04-03 HISTORY — DX: Major depressive disorder, single episode, unspecified: F32.9

## 2014-04-03 NOTE — MAU Note (Signed)
Contractions felt like cramps and stomach tight, every 3 to 5 min apart. No bleeding.  No leaking.  3 cm / 50 % on Tuesday.  Baby moving a little less today.

## 2014-04-04 DIAGNOSIS — O4700 False labor before 37 completed weeks of gestation, unspecified trimester: Secondary | ICD-10-CM | POA: Diagnosis present

## 2014-04-04 DIAGNOSIS — Z87891 Personal history of nicotine dependence: Secondary | ICD-10-CM | POA: Diagnosis not present

## 2014-04-04 DIAGNOSIS — O479 False labor, unspecified: Secondary | ICD-10-CM

## 2014-04-04 LAB — POCT FERN TEST: POCT Fern Test: NEGATIVE

## 2014-04-04 NOTE — Progress Notes (Signed)
Pt having contraction. Will recheck.

## 2014-04-04 NOTE — MAU Provider Note (Signed)
  History     CSN: [id number]  Arrival date and time: 04/03/14 2323   None     Chief Complaint  Patient presents with  . Labor Eval   HPI  Pt is a 33 yo G4P1021 at [redacted]w[redacted]d wks IUP here with report of contractions felt like contractions every 3 to 5 min apart. No bleeding. Reports clear discharge, but does not feel it is rupture of membranes. 3 cm / 50 % on Tuesday. Baby moving a little less today.   Past Medical History  Diagnosis Date  . Asthma   . HSV (herpes simplex virus) infection   . H/O hearing loss     90%left, 10%right  . Headache(784.0)   . Depression   . Anxiety   . History of panic attacks     after death of mother    Past Surgical History  Procedure Laterality Date  . Upper gastrointestinal endoscopy    . Ankle reconstruction      Family History  Problem Relation Age of Onset  . Hypertension Father   . Hyperlipidemia Father   . Diabetes Father     History  Substance Use Topics  . Smoking status: Former Smoker    Types: Cigarettes  . Smokeless tobacco: Not on file  . Alcohol Use: Yes     Comment: prior to preg    Allergies:  Allergies  Allergen Reactions  . Penicillins Hives    Prescriptions prior to admission  Medication Sig Dispense Refill  . acetaminophen (TYLENOL) 500 MG tablet Take 1,000 mg by mouth every 6 (six) hours as needed for headache.      . cyclobenzaprine (FLEXERIL) 5 MG tablet Take 5 mg by mouth 3 (three) times daily as needed for muscle spasms.      . Prenatal Vit-Fe Fumarate-FA (PRENATAL VITAMIN PO) Take 1 tablet by mouth daily.        Review of Systems  Gastrointestinal: Positive for abdominal pain (contractions).  Genitourinary:       Vaginal discharge   Physical Exam   Blood pressure 114/76, pulse 94, temperature 98 F (36.7 C), temperature source Oral, resp. rate 18, unknown if currently breastfeeding.  Physical Exam  Constitutional: She is oriented to person, place, and time. She appears well-developed and  well-nourished. No distress.  HENT:  Head: Normocephalic.  Neck: Normal range of motion. Neck supple.  Cardiovascular: Normal rate, regular rhythm and normal heart sounds.   Respiratory: Effort normal and breath sounds normal.  GI: Soft. There is no tenderness.  Genitourinary: No bleeding around the vagina. Vaginal discharge ( mucusy; neg fern, neg pooling) found.  Musculoskeletal: Normal range of motion.  Neurological: She is alert and oriented to person, place, and time.  Skin: Skin is warm and dry.   Dilation: 3.5 Effacement (%): 50 Station: -2 Presentation: Vertex Exam by:: Roney Marion CNM  MAU Course  Procedures  Dr. Claiborne Billings called with report by RN Ardath Sax; pt declines recheck of cervix prior to discharge home  Assessment and Plan  33 yo G4P1021 at 107w1d wks IUP Contractions Category I FHR Tracing  Plan: Discharge to home Reviewed preterm labor precautions  KARIM, [facility] N 04/04/2014, 1:21 AM

## 2014-04-04 NOTE — Progress Notes (Signed)
Pt with questions about how to tell if she is in labor or not.  Explained about cervical change and offered to recheck pt and she declined.  D/C teaching given including what to return for, contractions strengthen, closer, leaking, bleeding, change in baby movement.  Pt still with questions about whether she is in labor or not.  Notified W Muhammad CNM and she went to see pt, discussed signs of labor with pt and her husband.  Offered to recheck pt and pt declined again.

## 2014-04-04 NOTE — Discharge Instructions (Signed)
Vaginal Delivery °During delivery, your health care provider will help you give birth to your baby. During a vaginal delivery, you will work to push the baby out of your vagina. However, before you can push your baby out, a few things need to happen. The opening of your uterus (cervix) has to soften, thin out, and open up (dilate) all the way to 10 cm. Also, your baby has to move down from the uterus into your vagina.  °SIGNS OF LABOR  °Your health care provider will first need to make sure you are in labor. Signs of labor include:  °· Passing what is called the mucous plug before labor begins. This is a small amount of blood-stained mucus. °· Having regular, painful uterine contractions.   °· The time between contractions gets shorter.   °· The discomfort and pain gradually get more intense. °· Contraction pains get worse when walking and do not go away when resting.   °· Your cervix becomes thinner (effacement) and dilates. °BEFORE THE DELIVERY °Once you are in labor and admitted into the hospital or care center, your health care provider may do the following:  °· Perform a complete physical exam. °· Review any complications related to pregnancy or labor.  °· Check your blood pressure, pulse, temperature, and heart rate (vital signs).   °· Determine if, and when, the rupture of amniotic membranes occurred. °· Do a vaginal exam (using a sterile glove and lubricant) to determine:   °¨ The position (presentation) of the baby. Is the baby's head presenting first (vertex) in the birth canal (vagina), or are the feet or buttocks first (breech)?   °¨ The level (station) of the baby's head within the birth canal.   °¨ The effacement and dilatation of the cervix.   °· An electronic fetal monitor is usually placed on your abdomen when you first arrive. This is used to monitor your contractions and the baby's heart rate. °¨ When the monitor is on your abdomen (external fetal monitor), it can only pick up the frequency and  length of your contractions. It cannot tell the strength of your contractions. °¨ If it becomes necessary for your health care provider to know exactly how strong your contractions are or to see exactly what the baby's heart rate is doing, an internal monitor may be inserted into your vagina and uterus. Your health care provider will discuss the benefits and risks of using an internal monitor and obtain your permission before inserting the device. °¨ Continuous fetal monitoring may be needed if you have an epidural, are receiving certain medicines (such as oxytocin), or have pregnancy or labor complications. °· An IV access tube may be placed into a vein in your arm to deliver fluids and medicines if necessary. °THREE STAGES OF LABOR AND DELIVERY °Normal labor and delivery is divided into three stages. °First Stage °This stage starts when you begin to contract regularly and your cervix begins to efface and dilate. It ends when your cervix is completely open (fully dilated). The first stage is the longest stage of labor and can last from 3 hours to 15 hours.  °Several methods are available to help with labor pain. You and your health care provider will decide which option is best for you. Options include:  °· Opioid medicines. These are strong pain medicines that you can get through your IV tube or as a shot into your muscle. These medicines lessen pain but do not make it go away completely.  °· Epidural. A medicine is given through a thin tube that   is inserted in your back. The medicine numbs the lower part of your body and prevents any pain in that area.  Paracervical pain medicine. This is an injection of an anesthetic on each side of your cervix.   You may request natural childbirth, which does not involve the use of pain medicines or an epidural during labor and delivery. Instead, you will use other things, such as breathing exercises, to help cope with the pain. Second Stage The second stage of labor  begins when your cervix is fully dilated at 10 cm. It continues until you push your baby down through the birth canal and the baby is born. This stage can take only minutes or several hours.  The location of your baby's head as it moves through the birth canal is reported as a number called a station. If the baby's head has not started its descent, the station is described as being at minus 3 (-3). When your baby's head is at the zero station, it is at the middle of the birth canal and is engaged in the pelvis. The station of your baby helps indicate the progress of the second stage of labor.  When your baby is born, your health care provider may hold the baby with his or her head lowered to prevent amniotic fluid, mucus, and blood from getting into the baby's lungs. The baby's mouth and nose may be suctioned with a small bulb syringe to remove any additional fluid.  Your health care provider may then place the baby on your stomach. It is important to keep the baby from getting cold. To do this, the health care provider will dry the baby off, place the baby directly on your skin (with no blankets between you and the baby), and cover the baby with warm, dry blankets.   The umbilical cord is cut. Third Stage During the third stage of labor, your health care provider will deliver the placenta (afterbirth) and make sure your bleeding is under control. The delivery of the placenta usually takes about 5 minutes but can take up to 30 minutes. After the placenta is delivered, a medicine may be given either by IV or injection to help contract the uterus and control bleeding. If you are planning to breastfeed, you can try to do so now. After you deliver the placenta, your uterus should contract and get very firm. If your uterus does not remain firm, your health care provider will massage it. This is important because the contraction of the uterus helps cut off bleeding at the site where the placenta was attached  to your uterus. If your uterus does not contract properly and stay firm, you may continue to bleed heavily. If there is a lot of bleeding, medicines may be given to contract the uterus and stop the bleeding.  Document Released: 04/24/2008 Document Revised: 11/30/2013 Document Reviewed: 01/04/2013 [facility]Exit[facility] Patient Information 2015 [place], [place]. This information is not intended to replace advice given to you by your health care provider. Make sure you discuss any questions you have with your health care provider. Braxton Hicks Contractions Contractions of the uterus can occur throughout pregnancy. Contractions are not always a sign that you are in labor.  WHAT ARE BRAXTON HICKS CONTRACTIONS?  Contractions that occur before labor are called Braxton Hicks contractions, or false labor. Toward the end of pregnancy (32-34 weeks), these contractions can develop more often and may become more forceful. This is not true labor because these contractions do not result in opening (  dilatation) and thinning of the cervix. They are sometimes difficult to tell apart from true labor because these contractions can be forceful and people have different pain tolerances. You should not feel embarrassed if you go to the hospital with false labor. Sometimes, the only way to tell if you are in true labor is for your health care provider to look for changes in the cervix. If there are no prenatal problems or other health problems associated with the pregnancy, it is completely safe to be sent home with false labor and await the onset of true labor. HOW CAN YOU TELL THE DIFFERENCE BETWEEN TRUE AND FALSE LABOR? False Labor  The contractions of false labor are usually shorter and not as hard as those of true labor.   The contractions are usually irregular.   The contractions are often felt in the front of the lower abdomen and in the groin.   The contractions may go away when you walk around or change positions while  lying down.   The contractions get weaker and are shorter lasting as time goes on.   The contractions do not usually become progressively stronger, regular, and closer together as with true labor.  True Labor  Contractions in true labor last 30-70 seconds, become very regular, usually become more intense, and increase in frequency.   The contractions do not go away with walking.   The discomfort is usually felt in the top of the uterus and spreads to the lower abdomen and low back.   True labor can be determined by your health care provider with an exam. This will show that the cervix is dilating and getting thinner.  WHAT TO REMEMBER  Keep up with your usual exercises and follow other instructions given by your health care provider.   Take medicines as directed by your health care provider.   Keep your regular prenatal appointments.   Eat and drink lightly if you think you are going into labor.   If Braxton Hicks contractions are making you uncomfortable:   Change your position from lying down or resting to walking, or from walking to resting.   Sit and rest in a tub of warm water.   Drink 2-3 glasses of water. Dehydration may cause these contractions.   Do slow and deep breathing several times an hour.  WHEN SHOULD I SEEK IMMEDIATE MEDICAL CARE? Seek immediate medical care if:  Your contractions become stronger, more regular, and closer together.   You have fluid leaking or gushing from your vagina.   You have a fever.   You pass blood-tinged mucus.   You have vaginal bleeding.   You have continuous abdominal pain.   You have low back pain that you never had before.   You feel your baby's head pushing down and causing pelvic pressure.   Your baby is not moving as much as it used to.  Document Released: 07/16/2005 Document Revised: 07/21/2013 Document Reviewed: 04/27/2013 [facility] Patient Information 2015 [place], [place]. This  information is not intended to replace advice given to you by your health care provider. Make sure you discuss any questions you have with your health care provider. Fetal Movement Counts Patient Name: __________________________________________________ Patient Due Date: ____________________ Performing a fetal movement count is highly recommended in high-risk pregnancies, but it is good for every pregnant woman to do. Your health care provider may ask you to start counting fetal movements at 28 weeks of the pregnancy. Fetal movements often increase:  After eating a full  meal.  After physical activity.  After eating or drinking something sweet or cold.  At rest. Pay attention to when you feel the baby is most active. This will help you notice a pattern of your baby's sleep and wake cycles and what factors contribute to an increase in fetal movement. It is important to perform a fetal movement count at the same time each day when your baby is normally most active.  HOW TO COUNT FETAL MOVEMENTS 1. Find a quiet and comfortable area to sit or lie down on your left side. Lying on your left side provides the best blood and oxygen circulation to your baby. 2. Write down the day and time on a sheet of paper or in a journal. 3. Start counting kicks, flutters, swishes, rolls, or jabs in a 2-hour period. You should feel at least 10 movements within 2 hours. 4. If you do not feel 10 movements in 2 hours, wait 2-3 hours and count again. Look for a change in the pattern or not enough counts in 2 hours. SEEK MEDICAL CARE IF:  You feel less than 10 counts in 2 hours, tried twice.  There is no movement in over an hour.  The pattern is changing or taking longer each day to reach 10 counts in 2 hours.  You feel the baby is not moving as he or she usually does. Date: ____________ Movements: ____________ Start time: ____________ Doreatha Martin time: ____________  Date: ____________ Movements: ____________ Start time:  ____________ Doreatha Martin time: ____________ Date: ____________ Movements: ____________ Start time: ____________ Doreatha Martin time: ____________ Date: ____________ Movements: ____________ Start time: ____________ Doreatha Martin time: ____________ Date: ____________ Movements: ____________ Start time: ____________ Doreatha Martin time: ____________ Date: ____________ Movements: ____________ Start time: ____________ Doreatha Martin time: ____________ Date: ____________ Movements: ____________ Start time: ____________ Doreatha Martin time: ____________ Date: ____________ Movements: ____________ Start time: ____________ Doreatha Martin time: ____________  Date: ____________ Movements: ____________ Start time: ____________ Doreatha Martin time: ____________ Date: ____________ Movements: ____________ Start time: ____________ Doreatha Martin time: ____________ Date: ____________ Movements: ____________ Start time: ____________ Doreatha Martin time: ____________ Date: ____________ Movements: ____________ Start time: ____________ Doreatha Martin time: ____________ Date: ____________ Movements: ____________ Start time: ____________ Doreatha Martin time: ____________ Date: ____________ Movements: ____________ Start time: ____________ Doreatha Martin time: ____________ Date: ____________ Movements: ____________ Start time: ____________ Doreatha Martin time: ____________  Date: ____________ Movements: ____________ Start time: ____________ Doreatha Martin time: ____________ Date: ____________ Movements: ____________ Start time: ____________ Doreatha Martin time: ____________ Date: ____________ Movements: ____________ Start time: ____________ Doreatha Martin time: ____________ Date: ____________ Movements: ____________ Start time: ____________ Doreatha Martin time: ____________ Date: ____________ Movements: ____________ Start time: ____________ Doreatha Martin time: ____________ Date: ____________ Movements: ____________ Start time: ____________ Doreatha Martin time: ____________ Date: ____________ Movements: ____________ Start time: ____________ Doreatha Martin time: ____________  Date:  ____________ Movements: ____________ Start time: ____________ Doreatha Martin time: ____________ Date: ____________ Movements: ____________ Start time: ____________ Doreatha Martin time: ____________ Date: ____________ Movements: ____________ Start time: ____________ Doreatha Martin time: ____________ Date: ____________ Movements: ____________ Start time: ____________ Doreatha Martin time: ____________ Date: ____________ Movements: ____________ Start time: ____________ Doreatha Martin time: ____________ Date: ____________ Movements: ____________ Start time: ____________ Doreatha Martin time: ____________ Date: ____________ Movements: ____________ Start time: ____________ Doreatha Martin time: ____________  Date: ____________ Movements: ____________ Start time: ____________ Doreatha Martin time: ____________ Date: ____________ Movements: ____________ Start time: ____________ Doreatha Martin time: ____________ Date: ____________ Movements: ____________ Start time: ____________ Doreatha Martin time: ____________ Date: ____________ Movements: ____________ Start time: ____________ Doreatha Martin time: ____________ Date: ____________ Movements: ____________ Start time: ____________ Doreatha Martin time: ____________ Date: ____________ Movements: ____________ Start time: ____________ Doreatha Martin time: ____________ Date: ____________ Movements: ____________ Start time:  ____________ Doreatha MartinFinish time: ____________  Date: ____________ Movements: ____________ Start time: ____________ Doreatha MartinFinish time: ____________ Date: ____________ Movements: ____________ Start time: ____________ Doreatha MartinFinish time: ____________ Date: ____________ Movements: ____________ Start time: ____________ Doreatha MartinFinish time: ____________ Date: ____________ Movements: ____________ Start time: ____________ Doreatha MartinFinish time: ____________ Date: ____________ Movements: ____________ Start time: ____________ Doreatha MartinFinish time: ____________ Date: ____________ Movements: ____________ Start time: ____________ Doreatha MartinFinish time: ____________ Date: ____________ Movements: ____________ Start  time: ____________ Doreatha MartinFinish time: ____________  Date: ____________ Movements: ____________ Start time: ____________ Doreatha MartinFinish time: ____________ Date: ____________ Movements: ____________ Start time: ____________ Doreatha MartinFinish time: ____________ Date: ____________ Movements: ____________ Start time: ____________ Doreatha MartinFinish time: ____________ Date: ____________ Movements: ____________ Start time: ____________ Doreatha MartinFinish time: ____________ Date: ____________ Movements: ____________ Start time: ____________ Doreatha MartinFinish time: ____________ Date: ____________ Movements: ____________ Start time: ____________ Doreatha MartinFinish time: ____________ Date: ____________ Movements: ____________ Start time: ____________ Doreatha MartinFinish time: ____________  Date: ____________ Movements: ____________ Start time: ____________ Doreatha MartinFinish time: ____________ Date: ____________ Movements: ____________ Start time: ____________ Doreatha MartinFinish time: ____________ Date: ____________ Movements: ____________ Start time: ____________ Doreatha MartinFinish time: ____________ Date: ____________ Movements: ____________ Start time: ____________ Doreatha MartinFinish time: ____________ Date: ____________ Movements: ____________ Start time: ____________ Doreatha MartinFinish time: ____________ Date: ____________ Movements: ____________ Start time: ____________ Doreatha MartinFinish time: ____________ Document Released: 08/15/2006 Document Revised: 11/30/2013 Document Reviewed: 05/12/2012 [employer] Patient Information 2015 [place], [place]. This information is not intended to replace advice given to you by your health care provider. Make sure you discuss any questions you have with your health care provider.

## 2014-04-07 ENCOUNTER — Encounter (HOSPITAL_COMMUNITY): Payer: Self-pay

## 2014-04-09 ENCOUNTER — Inpatient Hospital Stay (HOSPITAL_COMMUNITY)
Admission: AD | Admit: 2014-04-09 | Discharge: 2014-04-09 | Disposition: A | Discharge status: Home / Self Care | Payer: 59 | Source: Ambulatory Visit | Attending: Obstetrics and Gynecology | Admitting: Obstetrics and Gynecology

## 2014-04-09 ENCOUNTER — Encounter (HOSPITAL_COMMUNITY): Payer: Self-pay | Admitting: General Practice

## 2014-04-09 DIAGNOSIS — O368390 Maternal care for abnormalities of the fetal heart rate or rhythm, unspecified trimester, not applicable or unspecified: Secondary | ICD-10-CM | POA: Insufficient documentation

## 2014-04-09 NOTE — [facility] Note (Signed)
Pt presents to [facility] from office for NST

## 2014-04-09 NOTE — Discharge Instructions (Signed)
Braxton Hicks Contractions Contractions of the uterus can occur throughout pregnancy. Contractions are not always a sign that you are in labor.  WHAT ARE BRAXTON HICKS CONTRACTIONS?  Contractions that occur before labor are called Braxton Hicks contractions, or false labor. Toward the end of pregnancy (32-34 weeks), these contractions can develop more often and may become more forceful. This is not true labor because these contractions do not result in opening (dilatation) and thinning of the cervix. They are sometimes difficult to tell apart from true labor because these contractions can be forceful and people have different pain tolerances. You should not feel embarrassed if you go to the hospital with false labor. Sometimes, the only way to tell if you are in true labor is for your health care provider to look for changes in the cervix. If there are no prenatal problems or other health problems associated with the pregnancy, it is completely safe to be sent home with false labor and await the onset of true labor. HOW CAN YOU TELL THE DIFFERENCE BETWEEN TRUE AND FALSE LABOR? False Labor  The contractions of false labor are usually shorter and not as hard as those of true labor.   The contractions are usually irregular.   The contractions are often felt in the front of the lower abdomen and in the groin.   The contractions may go away when you walk around or change positions while lying down.   The contractions get weaker and are shorter lasting as time goes on.   The contractions do not usually become progressively stronger, regular, and closer together as with true labor.  True Labor  Contractions in true labor last 30-70 seconds, become very regular, usually become more intense, and increase in frequency.   The contractions do not go away with walking.   The discomfort is usually felt in the top of the uterus and spreads to the lower abdomen and low back.   True labor can be  determined by your health care provider with an exam. This will show that the cervix is dilating and getting thinner.  WHAT TO REMEMBER  Keep up with your usual exercises and follow other instructions given by your health care provider.   Take medicines as directed by your health care provider.   Keep your regular prenatal appointments.   Eat and drink lightly if you think you are going into labor.   If Braxton Hicks contractions are making you uncomfortable:   Change your position from lying down or resting to walking, or from walking to resting.   Sit and rest in a tub of warm water.   Drink 2-3 glasses of water. Dehydration may cause these contractions.   Do slow and deep breathing several times an hour.  WHEN SHOULD I SEEK IMMEDIATE MEDICAL CARE? Seek immediate medical care if:  Your contractions become stronger, more regular, and closer together.   You have fluid leaking or gushing from your vagina.   You have a fever.   You pass blood-tinged mucus.   You have vaginal bleeding.   You have continuous abdominal pain.   You have low back pain that you never had before.   You feel your baby's head pushing down and causing pelvic pressure.   Your baby is not moving as much as it used to.  Document Released: 07/16/2005 Document Revised: 07/21/2013 Document Reviewed: 04/27/2013 Exit[employer] Patient Information 2015 [employer]. This information is not intended to replace advice given to you by your health care   provider. Make sure you discuss any questions you have with your health care provider.  Fetal Movement Counts Patient Name: __________________________________________________ Patient Due Date: ____________________ Performing a fetal movement count is highly recommended in high-risk pregnancies, but it is good for every pregnant woman to do. Your health care provider may ask you to start counting fetal movements at 28 weeks of the pregnancy. Fetal  movements often increase:  After eating a full meal.  After physical activity.  After eating or drinking something sweet or cold.  At rest. Pay attention to when you feel the baby is most active. This will help you notice a pattern of your baby's sleep and wake cycles and what factors contribute to an increase in fetal movement. It is important to perform a fetal movement count at the same time each day when your baby is normally most active.  HOW TO COUNT FETAL MOVEMENTS 1. Find a quiet and comfortable area to sit or lie down on your left side. Lying on your left side provides the best blood and oxygen circulation to your baby. 2. Write down the day and time on a sheet of paper or in a journal. 3. Start counting kicks, flutters, swishes, rolls, or jabs in a 2-hour period. You should feel at least 10 movements within 2 hours. 4. If you do not feel 10 movements in 2 hours, wait 2-3 hours and count again. Look for a change in the pattern or not enough counts in 2 hours. SEEK MEDICAL CARE IF:  You feel less than 10 counts in 2 hours, tried twice.  There is no movement in over an hour.  The pattern is changing or taking longer each day to reach 10 counts in 2 hours.  You feel the baby is not moving as he or she usually does. Date: ____________ Movements: ____________ Start time: ____________ Finish time: ____________  Date: ____________ Movements: ____________ Start time: ____________ Finish time: ____________ Date: ____________ Movements: ____________ Start time: ____________ Finish time: ____________ Date: ____________ Movements: ____________ Start time: ____________ Finish time: ____________ Date: ____________ Movements: ____________ Start time: ____________ Finish time: ____________ Date: ____________ Movements: ____________ Start time: ____________ Finish time: ____________ Date: ____________ Movements: ____________ Start time: ____________ Finish time: ____________ Date: ____________  Movements: ____________ Start time: ____________ Finish time: ____________  Date: ____________ Movements: ____________ Start time: ____________ Finish time: ____________ Date: ____________ Movements: ____________ Start time: ____________ Finish time: ____________ Date: ____________ Movements: ____________ Start time: ____________ Finish time: ____________ Date: ____________ Movements: ____________ Start time: ____________ Finish time: ____________ Date: ____________ Movements: ____________ Start time: ____________ Finish time: ____________ Date: ____________ Movements: ____________ Start time: ____________ Finish time: ____________ Date: ____________ Movements: ____________ Start time: ____________ Finish time: ____________  Date: ____________ Movements: ____________ Start time: ____________ Finish time: ____________ Date: ____________ Movements: ____________ Start time: ____________ Finish time: ____________ Date: ____________ Movements: ____________ Start time: ____________ Finish time: ____________ Date: ____________ Movements: ____________ Start time: ____________ Finish time: ____________ Date: ____________ Movements: ____________ Start time: ____________ Finish time: ____________ Date: ____________ Movements: ____________ Start time: ____________ Finish time: ____________ Date: ____________ Movements: ____________ Start time: ____________ Finish time: ____________  Date: ____________ Movements: ____________ Start time: ____________ Finish time: ____________ Date: ____________ Movements: ____________ Start time: ____________ Finish time: ____________ Date: ____________ Movements: ____________ Start time: ____________ Finish time: ____________ Date: ____________ Movements: ____________ Start time: ____________ Finish time: ____________ Date: ____________ Movements: ____________ Start time: ____________ Finish time: ____________ Date: ____________ Movements: ____________ Start time:  ____________ Finish time: ____________ Date: ____________ Movements:   ____________ Start time: ____________ Finish time: ____________  Date: ____________ Movements: ____________ Start time: ____________ Finish time: ____________ Date: ____________ Movements: ____________ Start time: ____________ Finish time: ____________ Date: ____________ Movements: ____________ Start time: ____________ Finish time: ____________ Date: ____________ Movements: ____________ Start time: ____________ Finish time: ____________ Date: ____________ Movements: ____________ Start time: ____________ Finish time: ____________ Date: ____________ Movements: ____________ Start time: ____________ Finish time: ____________ Date: ____________ Movements: ____________ Start time: ____________ Finish time: ____________  Date: ____________ Movements: ____________ Start time: ____________ Finish time: ____________ Date: ____________ Movements: ____________ Start time: ____________ Finish time: ____________ Date: ____________ Movements: ____________ Start time: ____________ Finish time: ____________ Date: ____________ Movements: ____________ Start time: ____________ Finish time: ____________ Date: ____________ Movements: ____________ Start time: ____________ Finish time: ____________ Date: ____________ Movements: ____________ Start time: ____________ Finish time: ____________ Date: ____________ Movements: ____________ Start time: ____________ Finish time: ____________  Date: ____________ Movements: ____________ Start time: ____________ Finish time: ____________ Date: ____________ Movements: ____________ Start time: ____________ Finish time: ____________ Date: ____________ Movements: ____________ Start time: ____________ Finish time: ____________ Date: ____________ Movements: ____________ Start time: ____________ Finish time: ____________ Date: ____________ Movements: ____________ Start time: ____________ Finish time: ____________ Date:  ____________ Movements: ____________ Start time: ____________ Finish time: ____________ Date: ____________ Movements: ____________ Start time: ____________ Finish time: ____________  Date: ____________ Movements: ____________ Start time: ____________ Finish time: ____________ Date: ____________ Movements: ____________ Start time: ____________ Finish time: ____________ Date: ____________ Movements: ____________ Start time: ____________ Finish time: ____________ Date: ____________ Movements: ____________ Start time: ____________ Finish time: ____________ Date: ____________ Movements: ____________ Start time: ____________ Finish time: ____________ Date: ____________ Movements: ____________ Start time: ____________ Finish time: ____________ Document Released: 08/15/2006 Document Revised: 11/30/2013 Document Reviewed: 05/12/2012 ExitCare Patient Information 2015 [employer]. This information is not intended to replace advice given to you by your health care provider. Make sure you discuss any questions you have with your health care provider.  

## 2014-04-18 ENCOUNTER — Inpatient Hospital Stay (HOSPITAL_COMMUNITY): Payer: 59 | Admitting: Anesthesiology

## 2014-04-18 ENCOUNTER — Encounter (HOSPITAL_COMMUNITY): Payer: 59 | Admitting: Anesthesiology

## 2014-04-18 ENCOUNTER — Encounter (HOSPITAL_COMMUNITY): Payer: Self-pay

## 2014-04-18 ENCOUNTER — Inpatient Hospital Stay (HOSPITAL_COMMUNITY)
Admission: AD | Admit: 2014-04-18 | Discharge: 2014-04-20 | DRG: 774 | Disposition: A | Discharge status: Home / Self Care | Payer: 59 | Source: Ambulatory Visit | Attending: Obstetrics and Gynecology | Admitting: Obstetrics and Gynecology

## 2014-04-18 DIAGNOSIS — O98519 Other viral diseases complicating pregnancy, unspecified trimester: Secondary | ICD-10-CM | POA: Diagnosis present

## 2014-04-18 DIAGNOSIS — F341 Dysthymic disorder: Secondary | ICD-10-CM | POA: Diagnosis present

## 2014-04-18 DIAGNOSIS — J45909 Unspecified asthma, uncomplicated: Secondary | ICD-10-CM | POA: Diagnosis present

## 2014-04-18 DIAGNOSIS — O479 False labor, unspecified: Secondary | ICD-10-CM | POA: Diagnosis present

## 2014-04-18 DIAGNOSIS — O99344 Other mental disorders complicating childbirth: Secondary | ICD-10-CM | POA: Diagnosis present

## 2014-04-18 DIAGNOSIS — H9190 Unspecified hearing loss, unspecified ear: Secondary | ICD-10-CM | POA: Diagnosis present

## 2014-04-18 DIAGNOSIS — Z87891 Personal history of nicotine dependence: Secondary | ICD-10-CM | POA: Diagnosis not present

## 2014-04-18 DIAGNOSIS — A6000 Herpesviral infection of urogenital system, unspecified: Secondary | ICD-10-CM | POA: Diagnosis present

## 2014-04-18 LAB — TYPE AND SCREEN
ABO/RH(D): O POS
Antibody Screen: NEGATIVE

## 2014-04-18 LAB — CBC
HCT: 35.1 % — ABNORMAL LOW (ref 36.0–46.0)
Hemoglobin: 12.2 g/dL (ref 12.0–15.0)
MCH: 29.4 pg (ref 26.0–34.0)
MCHC: 34.8 g/dL (ref 30.0–36.0)
MCV: 84.6 fL (ref 78.0–100.0)
Platelets: 200 10*3/uL (ref 150–400)
RBC: 4.15 MIL/uL (ref 3.87–5.11)
RDW: 13.2 % (ref 11.5–15.5)
WBC: 7.9 10*3/uL (ref 4.0–10.5)

## 2014-04-18 LAB — RPR

## 2014-04-18 LAB — POCT FERN TEST: POCT FERN TEST: POSITIVE

## 2014-04-18 MED ORDER — PHENYLEPHRINE 40 MCG/ML (10ML) SYRINGE FOR IV PUSH (FOR BLOOD PRESSURE SUPPORT)
80.0000 ug | PREFILLED_SYRINGE | INTRAVENOUS | Status: DC | PRN
  Filled 2014-04-18: qty 10
  Filled 2014-04-18: qty 2

## 2014-04-18 MED ORDER — MEASLES, MUMPS & RUBELLA VAC ~~LOC~~ INJ
0.5000 mL | INJECTION | Freq: Once | SUBCUTANEOUS | Status: DC
  Filled 2014-04-18: qty 0.5

## 2014-04-18 MED ORDER — DIBUCAINE 1 % RE OINT: 1.0000 "application " | TOPICAL_OINTMENT | RECTAL | Status: DC | PRN

## 2014-04-18 MED ORDER — PRENATAL MULTIVITAMIN CH
1.0000 | ORAL_TABLET | Freq: Every day | ORAL | Status: DC
  Administered 2014-04-19: 1 via ORAL
  Filled 2014-04-18: qty 1

## 2014-04-18 MED ORDER — LACTATED RINGERS IV SOLN
INTRAVENOUS | Status: DC
  Administered 2014-04-18: 13:00:00 via INTRAVENOUS

## 2014-04-18 MED ORDER — ONDANSETRON HCL 4 MG PO TABS: 4.0000 mg | ORAL_TABLET | ORAL | Status: DC | PRN

## 2014-04-18 MED ORDER — OXYTOCIN BOLUS FROM INFUSION: 500.0000 mL | INTRAVENOUS | Status: DC

## 2014-04-18 MED ORDER — OXYCODONE-ACETAMINOPHEN 5-325 MG PO TABS: 2.0000 | ORAL_TABLET | ORAL | Status: DC | PRN

## 2014-04-18 MED ORDER — EPHEDRINE 5 MG/ML INJ
10.0000 mg | INTRAVENOUS | Status: DC | PRN
  Filled 2014-04-18: qty 2

## 2014-04-18 MED ORDER — SIMETHICONE 80 MG PO CHEW: 80.0000 mg | CHEWABLE_TABLET | ORAL | Status: DC | PRN

## 2014-04-18 MED ORDER — OXYTOCIN 40 UNITS IN LACTATED RINGERS INFUSION - SIMPLE MED
62.5000 mL/h | INTRAVENOUS | Status: DC
  Administered 2014-04-18: 62.5 mL/h via INTRAVENOUS
  Filled 2014-04-18: qty 1000

## 2014-04-18 MED ORDER — LIDOCAINE HCL (PF) 1 % IJ SOLN
30.0000 mL | INTRAMUSCULAR | Status: DC | PRN
  Filled 2014-04-18: qty 30

## 2014-04-18 MED ORDER — LACTATED RINGERS IV SOLN: 500.0000 mL | Freq: Once | INTRAVENOUS | Status: DC

## 2014-04-18 MED ORDER — METHYLERGONOVINE MALEATE 0.2 MG/ML IJ SOLN: 0.2000 mg | INTRAMUSCULAR | Status: DC | PRN

## 2014-04-18 MED ORDER — LACTATED RINGERS IV SOLN: 500.0000 mL | INTRAVENOUS | Status: DC | PRN

## 2014-04-18 MED ORDER — FERROUS SULFATE 325 (65 FE) MG PO TABS
325.0000 mg | ORAL_TABLET | Freq: Two times a day (BID) | ORAL | Status: DC
  Administered 2014-04-19 – 2014-04-20 (×3): 325 mg via ORAL
  Filled 2014-04-18 (×3): qty 1

## 2014-04-18 MED ORDER — PHENYLEPHRINE 40 MCG/ML (10ML) SYRINGE FOR IV PUSH (FOR BLOOD PRESSURE SUPPORT)
80.0000 ug | PREFILLED_SYRINGE | INTRAVENOUS | Status: DC | PRN
  Filled 2014-04-18: qty 2

## 2014-04-18 MED ORDER — LIDOCAINE HCL (PF) 1 % IJ SOLN
INTRAMUSCULAR | Status: DC | PRN
  Administered 2014-04-18: 10 mL

## 2014-04-18 MED ORDER — CITRIC ACID-SODIUM CITRATE 334-500 MG/5ML PO SOLN: 30.0000 mL | ORAL | Status: DC | PRN

## 2014-04-18 MED ORDER — DIPHENHYDRAMINE HCL 25 MG PO CAPS: 25.0000 mg | ORAL_CAPSULE | Freq: Four times a day (QID) | ORAL | Status: DC | PRN

## 2014-04-18 MED ORDER — METHYLERGONOVINE MALEATE 0.2 MG/ML IJ SOLN
INTRAMUSCULAR | Status: AC
  Administered 2014-04-18: 0.2 mg
  Filled 2014-04-18: qty 1

## 2014-04-18 MED ORDER — LANOLIN HYDROUS EX OINT: TOPICAL_OINTMENT | CUTANEOUS | Status: DC | PRN

## 2014-04-18 MED ORDER — VALACYCLOVIR HCL 500 MG PO TABS
500.0000 mg | ORAL_TABLET | Freq: Every day | ORAL | Status: DC
  Administered 2014-04-18: 500 mg via ORAL
  Filled 2014-04-18 (×3): qty 1

## 2014-04-18 MED ORDER — INFLUENZA VAC SPLIT QUAD 0.5 ML IM SUSY
0.5000 mL | PREFILLED_SYRINGE | INTRAMUSCULAR | Status: AC
  Administered 2014-04-20: 0.5 mL via INTRAMUSCULAR

## 2014-04-18 MED ORDER — ACETAMINOPHEN 325 MG PO TABS: 650.0000 mg | ORAL_TABLET | ORAL | Status: DC | PRN

## 2014-04-18 MED ORDER — SODIUM CHLORIDE 0.9 % IJ SOLN: 3.0000 mL | Freq: Two times a day (BID) | INTRAMUSCULAR | Status: DC

## 2014-04-18 MED ORDER — SENNOSIDES-DOCUSATE SODIUM 8.6-50 MG PO TABS
2.0000 | ORAL_TABLET | ORAL | Status: DC
  Administered 2014-04-18: 2 via ORAL
  Filled 2014-04-18: qty 2

## 2014-04-18 MED ORDER — FENTANYL 2.5 MCG/ML BUPIVACAINE 1/10 % EPIDURAL INFUSION (WH - ANES)
14.0000 mL/h | INTRAMUSCULAR | Status: DC | PRN
  Administered 2014-04-18: 14 mL/h via EPIDURAL
  Filled 2014-04-18: qty 125

## 2014-04-18 MED ORDER — SODIUM CHLORIDE 0.9 % IJ SOLN: 3.0000 mL | INTRAMUSCULAR | Status: DC | PRN

## 2014-04-18 MED ORDER — PNEUMOCOCCAL VAC POLYVALENT 25 MCG/0.5ML IJ INJ
0.5000 mL | INJECTION | INTRAMUSCULAR | Status: AC
  Administered 2014-04-19: 0.5 mL via INTRAMUSCULAR
  Filled 2014-04-18 (×2): qty 0.5

## 2014-04-18 MED ORDER — OXYCODONE-ACETAMINOPHEN 5-325 MG PO TABS: 1.0000 | ORAL_TABLET | ORAL | Status: DC | PRN

## 2014-04-18 MED ORDER — ONDANSETRON HCL 4 MG/2ML IJ SOLN: 4.0000 mg | INTRAMUSCULAR | Status: DC | PRN

## 2014-04-18 MED ORDER — TETANUS-DIPHTH-ACELL PERTUSSIS 5-2.5-18.5 LF-MCG/0.5 IM SUSP
0.5000 mL | Freq: Once | INTRAMUSCULAR | Status: AC
  Administered 2014-04-19: 0.5 mL via INTRAMUSCULAR

## 2014-04-18 MED ORDER — BENZOCAINE-MENTHOL 20-0.5 % EX AERO
1.0000 "application " | INHALATION_SPRAY | CUTANEOUS | Status: DC | PRN
  Administered 2014-04-19: 1 via TOPICAL
  Filled 2014-04-18: qty 56

## 2014-04-18 MED ORDER — IBUPROFEN 800 MG PO TABS
800.0000 mg | ORAL_TABLET | Freq: Three times a day (TID) | ORAL | Status: DC
  Administered 2014-04-18 – 2014-04-20 (×5): 800 mg via ORAL
  Filled 2014-04-18 (×5): qty 1

## 2014-04-18 MED ORDER — METHYLERGONOVINE MALEATE 0.2 MG PO TABS: 0.2000 mg | ORAL_TABLET | ORAL | Status: DC | PRN

## 2014-04-18 MED ORDER — FENTANYL 2.5 MCG/ML BUPIVACAINE 1/10 % EPIDURAL INFUSION (WH - ANES): 14.0000 mL/h | INTRAMUSCULAR | Status: DC | PRN

## 2014-04-18 MED ORDER — DIPHENHYDRAMINE HCL 50 MG/ML IJ SOLN: 12.5000 mg | INTRAMUSCULAR | Status: DC | PRN

## 2014-04-18 MED ORDER — WITCH HAZEL-GLYCERIN EX PADS: 1.0000 "application " | MEDICATED_PAD | CUTANEOUS | Status: DC | PRN

## 2014-04-18 MED ORDER — SODIUM CHLORIDE 0.9 % IV SOLN: 250.0000 mL | INTRAVENOUS | Status: DC | PRN

## 2014-04-18 MED ORDER — MAGNESIUM HYDROXIDE 400 MG/5ML PO SUSP: 30.0000 mL | ORAL | Status: DC | PRN

## 2014-04-18 MED ORDER — ONDANSETRON HCL 4 MG/2ML IJ SOLN: 4.0000 mg | Freq: Four times a day (QID) | INTRAMUSCULAR | Status: DC | PRN

## 2014-04-18 MED ORDER — ZOLPIDEM TARTRATE 5 MG PO TABS
5.0000 mg | ORAL_TABLET | Freq: Every evening | ORAL | Status: DC | PRN
Start: 2014-04-18 — End: 2014-04-20

## 2014-04-18 MED ORDER — FLEET ENEMA 7-19 GM/118ML RE ENEM: 1.0000 | ENEMA | RECTAL | Status: DC | PRN

## 2014-04-18 NOTE — MAU Provider Note (Signed)
Request from MD for exam to r/o active HSV lesions d/t history of + HSV serum testing. Pt has never had outbreak, denies any current sx. Currently taking Valtrex for HSV prophylaxis. No evidence of active lesions on exam today.

## 2014-04-18 NOTE — H&P (Signed)
33 y.o. [name] comes in c/o SROM at 0945 this am.  Otherwise has good fetal movement and no bleeding.  Past Medical History  Diagnosis Date  . Asthma   . HSV (herpes simplex virus) infection   . H/O hearing loss     90%left, 10%right  . Headache(784.0)   . Depression   . Anxiety   . History of panic attacks     after death of mother    Past Surgical History  Procedure Laterality Date  . Upper gastrointestinal endoscopy    . Ankle reconstruction      OB History  Gravida Para Term Preterm AB SAB TAB Ectopic Multiple Living  0 0 0 1    # Outcome Date GA Lbr Len/2nd Weight Sex Delivery Anes PTL Lv  4 CUR           3 TRM 02/10/12 [redacted]w[redacted]d 17:45 / 01:35 3.345 kg (7 lb 6 oz) F SVD EPI  Y  2 SAB           1 TAB               History   Social History  . Marital Status: Single    Spouse Name: N/A    Number of Children: N/A  . Years of Education: N/A   Occupational History  . Not on file.   Social History Main Topics  . Smoking status: Former Smoker    Types: Cigarettes  . Smokeless tobacco: Never Used  . Alcohol Use: Yes     Comment: prior to preg  . Drug Use: No  . Sexual Activity: Not Currently    Birth Control/ Protection: None     Comment: last sex yesterday   Other Topics Concern  . Not on file   Social History Narrative  . No narrative on file   [place]    Prenatal Transfer Tool  Maternal Diabetes: No Genetic Screening: Normal Maternal Ultrasounds/Referrals: Normal Fetal Ultrasounds or other Referrals:  None Maternal Substance Abuse:  No Significant Maternal Medications:  None Significant Maternal Lab Results: None  Other PNC: uncomplicated.  Pt on VAltrex for HSV prophylaxisis.    Filed Vitals:   04/18/14 1502  BP: 113/81  Pulse: 75  Temp:   Resp: 18     Lungs/Cor:  NAD Abdomen:  soft, gravid Ex:  no cords, erythema SVE:  9/C/0 SSE by MAU NP - no active lesions. FHTs:  120, good STV, NST R Toco:  q 3-4   A/P    Term labor after SROM with no e/o active HSV.  GBS Neg.  Dana Frost A

## 2014-04-18 NOTE — MAU Note (Signed)
Pt up moving at bedside and walking

## 2014-04-18 NOTE — MAU Note (Signed)
Pt presents complaining of spontaneous rupture of membranes at 0945 this am. States it was a large gush of clear fluid. Reports contractions every 1-2 minutes. Denies vaginal bleeding or discharge. Reports good fetal movement.

## 2014-04-18 NOTE — Anesthesia Preprocedure Evaluation (Signed)
Anesthesia Evaluation  Patient identified by MRN, date of birth, ID band Patient awake    Reviewed: Allergy & Precautions, H&P , Patient's Chart, lab work & pertinent test results  Airway Mallampati: II  TM Distance: >3 FB Neck ROM: full    Dental  (+) Teeth Intact   Pulmonary asthma , former smoker,  breath sounds clear to auscultation        Cardiovascular Rhythm:regular Rate:Normal     Neuro/Psych    GI/Hepatic   Endo/Other  Morbid obesity  Renal/GU      Musculoskeletal   Abdominal   Peds  Hematology   Anesthesia Other Findings       Reproductive/Obstetrics (+) Pregnancy                            Anesthesia Physical Anesthesia Plan  ASA: III  Anesthesia Plan: Epidural   Post-op Pain Management:    Induction:   Airway Management Planned:   Additional Equipment:   Intra-op Plan:   Post-operative Plan:   Informed Consent: I have reviewed the patients History and Physical, chart, labs and discussed the procedure including the risks, benefits and alternatives for the proposed anesthesia with the patient or authorized representative who has indicated his/her understanding and acceptance.   Dental Advisory Given  Plan Discussed with:   Anesthesia Plan Comments: (Labs checked- platelets confirmed with RN in room. Fetal heart tracing, per RN, reported to be stable enough for sitting procedure. Discussed epidural, and patient consents to the procedure:  included risk of possible headache,backache, failed block, allergic reaction, and nerve injury. This patient was asked if she had any questions or concerns before the procedure started.)        Anesthesia Quick Evaluation  

## 2014-04-18 NOTE — Anesthesia Procedure Notes (Signed)

## 2014-04-19 LAB — CBC
HCT: 29.9 % — ABNORMAL LOW (ref 36.0–46.0)
Hemoglobin: 10.3 g/dL — ABNORMAL LOW (ref 12.0–15.0)
MCH: 29.3 pg (ref 26.0–34.0)
MCHC: 34.4 g/dL (ref 30.0–36.0)
MCV: 84.9 fL (ref 78.0–100.0)
PLATELETS: 178 10*3/uL (ref 150–400)
RBC: 3.52 MIL/uL — ABNORMAL LOW (ref 3.87–5.11)
RDW: 13.4 % (ref 11.5–15.5)
WBC: 11 10*3/uL — ABNORMAL HIGH (ref 4.0–10.5)

## 2014-04-19 NOTE — Lactation Note (Signed)
This note was copied from the chart of Dana Conchita Sonn. Lactation Consultation Note Experienced BF mom of exclusively BF for 7 months d/t lack of time able to pump at work. Plans to stay home and not work and BF exclusively. Denies any problems or pain w/BF this baby. States everything is going well. WH/LC brochure given w/resources, support groups and LC services.Mom encouraged to feed baby 8-12 times/24 hours and with feeding cues. Mom encouraged to waken baby for feeds. Encouraged comfort during BF so colostrum flows better and mom will enjoy the feeding longer. Taking deep breaths and breast massage during BF. Referred to Baby and Me Book in Breastfeeding section Pg. 22-23 for position options and Proper latch demonstration. Patient Name: Dana Frost [id number] Date: 04/19/2014 Reason for consult: Initial assessment   Maternal Data Has patient been taught Hand Expression?: Yes Does the patient have breastfeeding experience prior to this delivery?: Yes  Feeding Feeding Type: Breast Fed Length of feed: 25 min  LATCH Score/Interventions                      Lactation Tools Discussed/Used     Consult Status Consult Status: Follow-up Date: 04/20/14 Follow-up type: In-patient    Kobe Jansma, Diamond Nickel 04/19/2014, 6:20 AM

## 2014-04-19 NOTE — Progress Notes (Signed)
PPD#1 Pt without complaints. Lochia-wnl Desires Circ

## 2014-04-19 NOTE — Progress Notes (Signed)
Clinical Social Work Department PSYCHOSOCIAL ASSESSMENT - MATERNAL/CHILD 04/19/2014  Patient:  Dana Frost, Dana Frost  Account Number:  [id number]  Admit Date:  04/18/2014  Marjo Bicker Name:   Lucky Cowboy   Clinical Social Worker:  Loleta Books, CLINICAL SOCIAL WORKER   Date/Time:  04/19/2014 10:15 AM  Date Referred:  04/18/2014   Referral source  Central Nursery     Referred reason  Depression/Anxiety   Other referral source:    I:  FAMILY / HOME ENVIRONMENT Child's legal guardian:  PARENT  Guardian - Name Guardian - Age Guardian - Address  Charrise Lardner [address] [place], [place] [address]  Gershon Cull  same as above   Other household support members/support persons Name Relationship DOB  Zoe DAUGHTER 33 years old   Other support:   MOB and FOB expressed belief that they are well supported by their family and friends.    II  PSYCHOSOCIAL DATA Information Source:  Family Interview  Financial and [employer] Employment:   FOB is employed. MOB stated that she has not worked since May 2015, and that she intends on taking the next year off from work.  She reported looking forward to spending time at home with her children.   Financial resources:  Media planner If [employer]:    School / Grade:  N/A Government social research officer / Child Services Coordination / Early Interventions:   N/A  Cultural issues impacting care:   None reported    III  STRENGTHS Strengths  Adequate Resources  Home prepared for Child (including basic supplies)  Supportive family/friends   Strength comment:    IV  RISK FACTORS AND CURRENT PROBLEMS Current Problem:  YES   Risk Factor & Current Problem Patient Issue Family Issue Risk Factor / Current Problem Comment  Mental Illness Y N MOB presents with a mental health history significant for depression and panic attacks.  MOB stated that symptoms occured 15 years ago following the death of her mother. She denied any recent  symptoms, and denied history of postpartum depression.    V  SOCIAL WORK ASSESSMENT CSW spoke with the MOB in her room in order to complete the assessment. Consult was ordered due to MOB presenting with a history of depression and panic attacks.  FOB present for entire assessment with consent of the MOB.  MOB and FOB were easily engaged and receptive to the visit. MOB openly discussed her history of depression and anxiety.  MOB demonstrated full range in affect and presented in a pleasant mood.  MOB did not present with any acute symptoms of depression or anxiety.  MOB thanked CSW for visit.   MOB and FOB expressed excitement as they prepare to transition to having a second child in their home.  MOB and FOB shared that they are nervous about how their 48 1/33 year old will transition to being an older sister.  MOB and FOB discussed how they have already reached out to their friends who have recently experienced this same issue, and have identified ways to make the transition easier for the older siblings.  MOB and FOB shared that they feel well supported by family, friend, and employers.  MOB shared that she is looking forward to spending the next year at home with her children, and did not express any difficulties related to disengaging from her prior work identify.  As CSW continued to explore potential factors that have increased stress and that may impact transition into postpartum period, MOB mentioned physical  pain/discomfort during her final month of pregnancy.  She discussed normative stress associated with physical pain, and CSW did not note any other stressors.   MOB denied history of postpartum depression, but stated that she is hyper-aware of her increased risk due to her history of depression and anxiety. She stated that she had acute symptoms almost 15 years ago following the death of her mother.  She denied any recent symptoms, and stated that she has already had conversations with her MD about  her fear of developing postpartum depression.  Without prompting, MOB shared that she wanted to have the conversations with her MD in order to prepare if it were to occur.  MOB denied any barriers to medications if needed for symptom management.  MOB receptive to education on postpartum depression.   No barriers to discharge.     VI SOCIAL WORK PLAN Social Work Secretary/administrator  No Further Intervention Required / No Barriers to Discharge   Type of pt/family education:   Postpartum depression   If child protective services report - county:   If child protective services report - date:   Information/referral to community resources comment:   Other social work plan:   CSW to provide ongoing emotional support PRN.

## 2014-04-19 NOTE — Anesthesia Postprocedure Evaluation (Signed)
  Anesthesia Post-op Note  Patient: Dana Frost  Procedure(s) Performed: * No procedures listed *  Patient Location: PACU and Mother/Baby  Anesthesia Type:Epidural  Level of Consciousness: awake, alert  and oriented  Airway and Oxygen Therapy: Patient Spontanous Breathing  Post-op Pain: mild  Post-op Assessment: Patient's Cardiovascular Status Stable, Respiratory Function Stable, No signs of Nausea or vomiting, Adequate PO intake, Pain level controlled, No headache, No backache, No residual numbness and No residual motor weakness  Post-op Vital Signs: Reviewed and stable  Last Vitals:  Filed Vitals:   04/19/14 0610  BP: 118/60  Pulse: 96  Temp: 36.4 C  Resp: 20    Complications: No apparent anesthesia complications

## 2014-04-20 NOTE — Progress Notes (Signed)
Patient is eating, ambulating, voiding.  Pain control is good.  Appropriate lochia.  No complaints.  Filed Vitals:   04/18/14 2140 04/19/14 0610 04/19/14 1738 04/20/14 0710  BP: 120/77 118/60 125/71 96/53  Pulse: 89 96 78 67  Temp: 98.5 F (36.9 C) 97.6 F (36.4 C) 98.2 F (36.8 C) 98.1 F (36.7 C)  TempSrc: Oral Oral Oral Oral  Resp: Height:      Weight:      SpO2: 98% 99%      Fundus firm Perineum without swelling. No CT  Lab Results  Component Value Date   WBC 11.0* 04/19/2014   HGB 10.3* 04/19/2014   HCT 29.9* 04/19/2014   MCV 84.9 04/19/2014   PLT 178 04/19/2014    --/--/O POS (09/20 1517)  A/P Post partum day 2.  Routine care.  Expect d/c this am    [place], Luther Parody

## 2014-04-20 NOTE — Lactation Note (Signed)
This note was copied from the chart of Dana Marlissa Michel. Lactation Consultation Note: Mom reports that baby was sleepy after circ yesterday afternoon then cluster fed through the night. Reports nipples are a little tender but intact. Comfort gels given with instructions for use. Experienced BF mom. No questions at present. Reviewed BFSG and OP appointments as resources for support after DC. To call prn  Patient Name: Dana Frost [id number] Date: 04/20/2014 Reason for consult: Follow-up assessment   Maternal Data Formula Feeding for Exclusion: No Has patient been taught Hand Expression?: Yes Does the patient have breastfeeding experience prior to this delivery?: Yes  Feeding Feeding Type: Breast Fed Length of feed: 10 min  LATCH Score/Interventions                      Lactation Tools Discussed/Used     Consult Status Consult Status: Complete    Pamelia Hoit 04/20/2014, 9:17 AM

## 2014-04-20 NOTE — Discharge Summary (Signed)
Obstetric Discharge Summary Reason for Admission: rupture of membranes Prenatal Procedures: ultrasound Intrapartum Procedures: spontaneous vaginal delivery Postpartum Procedures: none Complications-Operative and Postpartum: none Hemoglobin  Date Value Ref Range Status  04/19/2014 10.3* 12.0 - 15.0 g/dL Final     HCT  Date Value Ref Range Status  04/19/2014 29.9* 36.0 - 46.0 % Final    Physical Exam:  General: alert and cooperative Lochia: appropriate Uterine Fundus: firm DVT Evaluation: No evidence of DVT seen on physical exam.  Discharge Diagnoses: Term Pregnancy-delivered  Discharge Information: Date: 04/20/2014 Activity: pelvic rest Diet: routine Medications: PNV and Ibuprofen Condition: improved Instructions: refer to practice specific booklet Discharge to: home Follow-up Information   Follow up with HORVATH,MICHELLE A, MD On 05/27/2014. (@ 10:45 am this is for your postpartum follow up.)    Specialty:  Obstetrics and Gynecology   Contact information:   [address] [place] [address] [phone]       Follow up with HORVATH,MICHELLE A, MD In 4 weeks.   Specialty:  Obstetrics and Gynecology   Contact information:   [address] [place] [address] [phone]       Newborn Data: Live born female  Birth Weight: 8 lb 2.3 oz (3694 g) APGAR: 9, 9  Home with mother.  Philip Aspen 04/20/2014, 10:17 AM

## 2014-04-26 ENCOUNTER — Inpatient Hospital Stay (HOSPITAL_COMMUNITY): Admission: RE | Admit: 2014-04-26 | Payer: No Typology Code available for payment source | Source: Ambulatory Visit

## 2014-05-31 ENCOUNTER — Encounter (HOSPITAL_COMMUNITY): Payer: Self-pay

## 2014-08-17 ENCOUNTER — Emergency Department (HOSPITAL_COMMUNITY): Payer: 59

## 2014-08-17 ENCOUNTER — Encounter (HOSPITAL_COMMUNITY): Payer: Self-pay | Admitting: Emergency Medicine

## 2014-08-17 ENCOUNTER — Emergency Department (HOSPITAL_COMMUNITY)
Admission: EM | Admit: 2014-08-17 | Discharge: 2014-08-17 | Disposition: A | Discharge status: Home / Self Care | Payer: 59 | Attending: Emergency Medicine | Admitting: Emergency Medicine

## 2014-08-17 DIAGNOSIS — R5383 Other fatigue: Secondary | ICD-10-CM | POA: Diagnosis not present

## 2014-08-17 DIAGNOSIS — R531 Weakness: Secondary | ICD-10-CM | POA: Diagnosis not present

## 2014-08-17 DIAGNOSIS — R091 Pleurisy: Secondary | ICD-10-CM

## 2014-08-17 DIAGNOSIS — J45909 Unspecified asthma, uncomplicated: Secondary | ICD-10-CM | POA: Insufficient documentation

## 2014-08-17 DIAGNOSIS — Z880 Allergy status to penicillin: Secondary | ICD-10-CM | POA: Insufficient documentation

## 2014-08-17 DIAGNOSIS — R42 Dizziness and giddiness: Secondary | ICD-10-CM | POA: Diagnosis not present

## 2014-08-17 DIAGNOSIS — Z3202 Encounter for pregnancy test, result negative: Secondary | ICD-10-CM | POA: Insufficient documentation

## 2014-08-17 DIAGNOSIS — Z8619 Personal history of other infectious and parasitic diseases: Secondary | ICD-10-CM | POA: Insufficient documentation

## 2014-08-17 DIAGNOSIS — Z8659 Personal history of other mental and behavioral disorders: Secondary | ICD-10-CM | POA: Diagnosis not present

## 2014-08-17 DIAGNOSIS — H9190 Unspecified hearing loss, unspecified ear: Secondary | ICD-10-CM | POA: Diagnosis not present

## 2014-08-17 DIAGNOSIS — N83202 Unspecified ovarian cyst, left side: Secondary | ICD-10-CM

## 2014-08-17 DIAGNOSIS — Z79899 Other long term (current) drug therapy: Secondary | ICD-10-CM | POA: Diagnosis not present

## 2014-08-17 DIAGNOSIS — N8320 Unspecified ovarian cysts: Secondary | ICD-10-CM | POA: Diagnosis not present

## 2014-08-17 DIAGNOSIS — R1032 Left lower quadrant pain: Secondary | ICD-10-CM | POA: Diagnosis present

## 2014-08-17 DIAGNOSIS — R079 Chest pain, unspecified: Secondary | ICD-10-CM | POA: Diagnosis not present

## 2014-08-17 DIAGNOSIS — Z87891 Personal history of nicotine dependence: Secondary | ICD-10-CM | POA: Insufficient documentation

## 2014-08-17 LAB — CBC
HCT: 38.3 % (ref 36.0–46.0)
Hemoglobin: 12.5 g/dL (ref 12.0–15.0)
MCH: 27.6 pg (ref 26.0–34.0)
MCHC: 32.6 g/dL (ref 30.0–36.0)
MCV: 84.5 fL (ref 78.0–100.0)
Platelets: 275 10*3/uL (ref 150–400)
RBC: 4.53 MIL/uL (ref 3.87–5.11)
RDW: 13.3 % (ref 11.5–15.5)
WBC: 4.8 10*3/uL (ref 4.0–10.5)

## 2014-08-17 LAB — COMPREHENSIVE METABOLIC PANEL
ALT: 39 U/L — AB (ref 0–35)
AST: 18 U/L (ref 0–37)
Albumin: 3.8 g/dL (ref 3.5–5.2)
Alkaline Phosphatase: 101 U/L (ref 39–117)
Anion gap: 4 — ABNORMAL LOW (ref 5–15)
BUN: 9 mg/dL (ref 6–23)
CO2: 29 mmol/L (ref 19–32)
Calcium: 9.3 mg/dL (ref 8.4–10.5)
Chloride: 105 mEq/L (ref 96–112)
Creatinine, Ser: 0.92 mg/dL (ref 0.50–1.10)
GFR, EST NON AFRICAN AMERICAN: 81 mL/min — AB (ref >90)
Glucose, Bld: 95 mg/dL (ref 70–99)
POTASSIUM: 4.2 mmol/L (ref 3.5–5.1)
Sodium: 138 mmol/L (ref 135–145)
TOTAL PROTEIN: 7.4 g/dL (ref 6.0–8.3)
Total Bilirubin: 0.5 mg/dL (ref 0.3–1.2)

## 2014-08-17 LAB — I-STAT TROPONIN, ED: Troponin i, poc: 0 ng/mL (ref 0.00–0.08)

## 2014-08-17 LAB — URINALYSIS, ROUTINE W REFLEX MICROSCOPIC
Bilirubin Urine: NEGATIVE
Glucose, UA: NEGATIVE mg/dL
Hgb urine dipstick: NEGATIVE
Ketones, ur: NEGATIVE mg/dL
Leukocytes, UA: NEGATIVE
NITRITE: NEGATIVE
PROTEIN: NEGATIVE mg/dL
SPECIFIC GRAVITY, URINE: 1.012 (ref 1.005–1.030)
Urobilinogen, UA: 0.2 mg/dL (ref 0.0–1.0)
pH: 7 (ref 5.0–8.0)

## 2014-08-17 LAB — POC URINE PREG, ED: Preg Test, Ur: NEGATIVE

## 2014-08-17 NOTE — ED Provider Notes (Signed)
CSN: [id number]     Arrival date & time 08/17/14  [id number] History   First MD Initiated Contact with Patient 08/17/14 [id number]     Chief Complaint  Patient presents with  . Chest Pain  . Abdominal Pain    Left lower quadrant     (Consider location/radiation/quality/duration/timing/severity/associated sxs/prior Treatment) HPI Comments: 34 year old female with past medical history of asthma, depression, anxiety and panic attacks presenting with 2 complaints. First, patient reports sided chest pain 4 months. Patient reports a dull pain that increases with inspiration and becomes sharp. She has not tried any alleviating factors for her symptoms. Pain comes and goes at random. Denies shortness of breath. States 10 years ago she had pleurisy and believes this may feel the same. Denies any exertional activity out of her normal. States many of her uncles have a history of early heart disease. Nonsmoker. She is currently breast-feeding and she gave birth 4 months ago. No exogenous estrogen. No recent surgeries. She is also complaining of left lower quadrant pain 3 days. Pain described as a dull ache, increasing when she stands, relieved by sitting. Pain currently 4/10. She finished her menses one day ago, this was her first menstrual cycle since giving birth. She has been sexually active with her husband and is not on birth control. Denies fever or chills. States she's been feeling fatigued and slightly lightheaded. Mild nausea, no vomiting or diarrhea.  Patient is a 34 y.o. female presenting with chest pain and abdominal pain. The history is provided by the patient.  Chest Pain Associated symptoms: abdominal pain, fatigue, nausea and weakness   Abdominal Pain Associated symptoms: chest pain, fatigue and nausea     Past Medical History  Diagnosis Date  . Asthma   . HSV (herpes simplex virus) infection   . H/O hearing loss     90%left, 10%right  . Headache(784.0)   . Depression   . Anxiety   . History  of panic attacks     after death of mother   Past Surgical History  Procedure Laterality Date  . Upper gastrointestinal endoscopy    . Ankle reconstruction     Family History  Problem Relation Age of Onset  . Hypertension Father   . Hyperlipidemia Father   . Diabetes Father    History  Substance Use Topics  . Smoking status: Former Smoker    Types: Cigarettes  . Smokeless tobacco: Never Used  . Alcohol Use: Yes     Comment: prior to preg   OB History    Gravida Para Term Preterm AB TAB SAB Ectopic Multiple Living   4 2 2  0 2 1 1  0 0 2     Review of Systems  Constitutional: Positive for fatigue.  Cardiovascular: Positive for chest pain.  Gastrointestinal: Positive for nausea and abdominal pain.  Neurological: Positive for weakness and light-headedness.  All other systems reviewed and are negative.     Allergies  Penicillins  Home Medications   Prior to Admission medications   Medication Sig Start Date End Date Taking? Authorizing Provider  acetaminophen (TYLENOL) 500 MG tablet Take 1,000 mg by mouth every 6 (six) hours as needed for headache.   Yes Historical Provider, MD  albuterol (PROVENTIL HFA;VENTOLIN HFA) 108 (90 BASE) MCG/ACT inhaler Inhale 2 puffs into the lungs every 6 (six) hours as needed for wheezing or shortness of breath.   Yes Historical Provider, MD  Prenatal Vit-Fe Fumarate-FA (PRENATAL VITAMIN PO) Take 1 tablet by mouth daily.  Yes Historical Provider, MD   BP 117/83 mmHg  Pulse 75  Temp(Src) 98.7 F (37.1 C) (Oral)  Resp 19  Ht  (1.702 m)  Wt 230 lb (104.327 kg)  BMI 36.01 kg/m2  SpO2 99%  LMP 08/11/2014  Breastfeeding? Yes Physical Exam  Constitutional: She is oriented to person, place, and time. She appears well-developed and well-nourished. No distress.  HENT:  Head: Normocephalic and atraumatic.  Mouth/Throat: Oropharynx is clear and moist.  Eyes: Conjunctivae and EOM are normal. Pupils are equal, round, and reactive to  light.  Neck: Normal range of motion. Neck supple. No JVD present.  Cardiovascular: Normal rate, regular rhythm, normal heart sounds and intact distal pulses.   Pulmonary/Chest: Effort normal and breath sounds normal. No respiratory distress.    TTP over bilateral breasts as shown in diagram. No rash. "Different" pain than her CP, states from breast feeding.  Abdominal: Soft. Normal appearance and bowel sounds are normal. She exhibits no distension. There is no tenderness.  Mild LLQ tenderness. No rigidity, guarding or rebound. No peritoneal signs.  Genitourinary: Cervix exhibits no motion tenderness and no discharge. Right adnexum displays no mass and no tenderness. Left adnexum displays tenderness. Left adnexum displays no mass. No vaginal discharge found.  Musculoskeletal: Normal range of motion. She exhibits no edema.  Neurological: She is alert and oriented to person, place, and time. She has normal strength. No sensory deficit.  Speech fluent, goal oriented. Moves limbs without ataxia. Equal grip strength bilateral.  Skin: Skin is warm and dry. She is not diaphoretic.  Psychiatric: She has a normal mood and affect. Her behavior is normal.  Nursing note and vitals reviewed.   ED Course  Procedures (including critical care time) Labs Review Labs Reviewed  COMPREHENSIVE METABOLIC PANEL - Abnormal; Notable for the following:    ALT 39 (*)    GFR calc non Af Amer 81 (*)    Anion gap 4 (*)    All other components within normal limits  WET PREP, GENITAL  CBC  URINALYSIS, ROUTINE W REFLEX MICROSCOPIC  POC URINE PREG, ED  Rosezena Sensor, ED    Imaging Review Dg Chest 2 View  08/17/2014   CLINICAL DATA:  Mid chest pain.  EXAM: CHEST  2 VIEW  COMPARISON:  None.  FINDINGS: Heart size and mediastinal contours are within normal limits. Both lungs are clear. Visualized skeletal structures are unremarkable.  IMPRESSION: Negative exam.   Electronically Signed   By: Drusilla Kanner M.D.    On: 08/17/2014 10:33   US Transvaginal Non-ob  08/17/2014   CLINICAL DATA:  Pelvic and left lower quadrant/flank pain  EXAM: TRANSABDOMINAL AND TRANSVAGINAL ULTRASOUND OF PELVIS  TECHNIQUE: Study was performed transabdominally to optimize pelvic field of view evaluation and transvaginally to optimize internal visceral architecture evaluation.  COMPARISON:  None  FINDINGS: Uterus  Measurements: 7.3 x 3.9 x 5.4 cm. Uterus is retroverted. No fibroids or other mass visualized.  Endometrium  Thickness: 9 mm. No focal abnormality visualized. The contour is smooth.  Right ovary  Measurements: 3.1 x 1.9 x 2.9 cm. Normal appearance/no adnexal mass.  Left ovary  Measurements: 3.3 x 2.9 x 1.7 cm. Normal appearance/no adnexal mass.  Other findings  Small amount of free fluid.  IMPRESSION: Small amount of free pelvic fluid. This amount of fluid may be upper physiologic or could indicate recent ovarian cyst rupture.  Uterus retroverted. No intrauterine or extrauterine pelvic mass is identified.   Electronically Signed   By: Chrissie Noa  Margarita Grizzle M.D.   On: 08/17/2014 11:54   US Pelvis Complete  08/17/2014   CLINICAL DATA:  Pelvic and left lower quadrant/flank pain  EXAM: TRANSABDOMINAL AND TRANSVAGINAL ULTRASOUND OF PELVIS  TECHNIQUE: Study was performed transabdominally to optimize pelvic field of view evaluation and transvaginally to optimize internal visceral architecture evaluation.  COMPARISON:  None  FINDINGS: Uterus  Measurements: 7.3 x 3.9 x 5.4 cm. Uterus is retroverted. No fibroids or other mass visualized.  Endometrium  Thickness: 9 mm. No focal abnormality visualized. The contour is smooth.  Right ovary  Measurements: 3.1 x 1.9 x 2.9 cm. Normal appearance/no adnexal mass.  Left ovary  Measurements: 3.3 x 2.9 x 1.7 cm. Normal appearance/no adnexal mass.  Other findings  Small amount of free fluid.  IMPRESSION: Small amount of free pelvic fluid. This amount of fluid may be upper physiologic or could indicate recent  ovarian cyst rupture.  Uterus retroverted. No intrauterine or extrauterine pelvic mass is identified.   Electronically Signed   By: Bretta Bang M.D.   On: 08/17/2014 11:54     EKG Interpretation   Date/Time:  Tuesday August 17 2014 08:37:39 EST Ventricular Rate:  89 PR Interval:  128 QRS Duration: 86 QT Interval:  358 QTC Calculation: 435 R Axis:   87 Text Interpretation:  Normal sinus rhythm Nonspecific T wave abnormality  Abnormal ECG Confirmed by ALLEN  MD, ANTHONY ([id number]) on 08/17/2014 10:51:45  AM      MDM   Final diagnoses:  Pleurisy  Left ovarian cyst   Pt in NAD. AFVSS. Doubt cardiac HEART score 2. Low suspicion. PERC negative. Doubt PE. Most see. Regarding left lower quadrant pain, she is mildly tender. Given that this happened around her menses, more likely GYN in nature rather than GI. Left adnexal tenderness on pelvic exam. Pelvic ultrasound showing small amount of free pelvic fluid, possible recent ovarian cyst rupture. Discussed findings with patient. Advised Tylenol for pain as she is breast-feeding. Follow-up with PCP and OB/GYN. Stable for discharge. Return precautions given. Patient states understanding of treatment care plan and is agreeable.  Kathrynn Speed, PA-C 08/17/14 1211  Toy Baker, MD 08/18/14 1003

## 2014-08-17 NOTE — Discharge Instructions (Signed)
You may take tylenol for your pain. Follow up with your primary care doctor.  Ovarian Cyst An ovarian cyst is a fluid-filled sac that forms on an ovary. The ovaries are small organs that produce eggs in women. Various types of cysts can form on the ovaries. Most are not cancerous. Many do not cause problems, and they often go away on their own. Some may cause symptoms and require treatment. Common types of ovarian cysts include:  Functional cysts--These cysts may occur every month during the menstrual cycle. This is normal. The cysts usually go away with the next menstrual cycle if the woman does not get pregnant. Usually, there are no symptoms with a functional cyst.  Endometrioma cysts--These cysts form from the tissue that lines the uterus. They are also called "chocolate cysts" because they become filled with blood that turns brown. This type of cyst can cause pain in the lower abdomen during intercourse and with your menstrual period.  Cystadenoma cysts--This type develops from the cells on the outside of the ovary. These cysts can get very big and cause lower abdomen pain and pain with intercourse. This type of cyst can twist on itself, cut off its blood supply, and cause severe pain. It can also easily rupture and cause a lot of pain.  Dermoid cysts--This type of cyst is sometimes found in both ovaries. These cysts may contain different kinds of body tissue, such as skin, teeth, hair, or cartilage. They usually do not cause symptoms unless they get very big.  Theca lutein cysts--These cysts occur when too much of a certain hormone (human chorionic gonadotropin) is produced and overstimulates the ovaries to produce an egg. This is most common after procedures used to assist with the conception of a baby (in vitro fertilization). CAUSES   Fertility drugs can cause a condition in which multiple large cysts are formed on the ovaries. This is called ovarian hyperstimulation syndrome.  A condition  called polycystic ovary syndrome can cause hormonal imbalances that can lead to nonfunctional ovarian cysts. SIGNS AND SYMPTOMS  Many ovarian cysts do not cause symptoms. If symptoms are present, they may include:  Pelvic pain or pressure.  Pain in the lower abdomen.  Pain during sexual intercourse.  Increasing girth (swelling) of the abdomen.  Abnormal menstrual periods.  Increasing pain with menstrual periods.  Stopping having menstrual periods without being pregnant. DIAGNOSIS  These cysts are commonly found during a routine or annual pelvic exam. Tests may be ordered to find out more about the cyst. These tests may include:  Ultrasound.  X-ray of the pelvis.  CT scan.  MRI.  Blood tests. TREATMENT  Many ovarian cysts go away on their own without treatment. Your health care provider may want to check your cyst regularly for 2-3 months to see if it changes. For women in menopause, it is particularly important to monitor a cyst closely because of the higher rate of ovarian cancer in menopausal women. When treatment is needed, it may include any of the following:  A procedure to drain the cyst (aspiration). This may be done using a long needle and ultrasound. It can also be done through a laparoscopic procedure. This involves using a thin, lighted tube with a tiny camera on the end (laparoscope) inserted through a small incision.  Surgery to remove the whole cyst. This may be done using laparoscopic surgery or an open surgery involving a larger incision in the lower abdomen.  Hormone treatment or birth control pills. These methods are  sometimes used to help dissolve a cyst. HOME CARE INSTRUCTIONS   Only take over-the-counter or prescription medicines as directed by your health care provider.  Follow up with your health care provider as directed.  Get regular pelvic exams and Pap tests. SEEK MEDICAL CARE IF:   Your periods are late, irregular, or painful, or they  stop.  Your pelvic pain or abdominal pain does not go away.  Your abdomen becomes larger or swollen.  You have pressure on your bladder or trouble emptying your bladder completely.  You have pain during sexual intercourse.  You have feelings of fullness, pressure, or discomfort in your stomach.  You lose weight for no apparent reason.  You feel generally ill.  You become constipated.  You lose your appetite.  You develop acne.  You have an increase in body and facial hair.  You are gaining weight, without changing your exercise and eating habits.  You think you are pregnant. SEEK IMMEDIATE MEDICAL CARE IF:   You have increasing abdominal pain.  You feel sick to your stomach (nauseous), and you throw up (vomit).  You develop a fever that comes on suddenly.  You have abdominal pain during a bowel movement.  Your menstrual periods become heavier than usual. MAKE SURE YOU:  Understand these instructions.  Will watch your condition.  Will get help right away if you are not doing well or get worse. Document Released: 07/16/2005 Document Revised: 07/21/2013 Document Reviewed: 03/23/2013 [facility] Patient Information 2015 [place], [place]. This information is not intended to replace advice given to you by your health care provider. Make sure you discuss any questions you have with your health care provider.  Pleurisy Pleurisy is an inflammation and swelling of the lining of the lungs (pleura). Because of this inflammation, it hurts to breathe. It can be aggravated by coughing, laughing, or deep breathing. Pleurisy is often caused by an underlying infection or disease.  HOME CARE INSTRUCTIONS  Monitor your pleurisy for any changes. The following actions may help to alleviate any discomfort you are experiencing:  Medicine may help with pain. Only take over-the-counter or prescription medicines for pain, discomfort, or fever as directed by your health care provider.  Only  take antibiotic medicine as directed. Make sure to finish it even if you start to feel better. SEEK MEDICAL CARE IF:   Your pain is not controlled with medicine or is increasing.  You have an increase in pus-like (purulent) secretions brought up with coughing. SEEK IMMEDIATE MEDICAL CARE IF:   You have blue or dark lips, fingernails, or toenails.  You are coughing up blood.  You have increased difficulty breathing.  You have continuing pain unrelieved by medicine or pain lasting more than 1 week.  You have pain that radiates into your neck, arms, or jaw.  You develop increased shortness of breath or wheezing.  You develop a fever, rash, vomiting, fainting, or other serious symptoms. MAKE SURE YOU:  Understand these instructions.   Will watch your condition.   Will get help right away if you are not doing well or get worse.  Document Released: 07/16/2005 Document Revised: 03/18/2013 Document Reviewed: 12/28/2012 [facility] Patient Information 2015 [place], [place]. This information is not intended to replace advice given to you by your health care provider. Make sure you discuss any questions you have with your health care provider.

## 2014-08-17 NOTE — ED Notes (Signed)
Patient coming from home with left sided chest pain that has been recurrent for 4 months.  Patient is also having LLQ pain.  Pain is 4/10.  Patient has been feeling lightheaded, dizzy, nauseated, back pains and generalized weakness.  Chest pain is worse with deep breaths.  BM today, normal.

## 2016-03-03 ENCOUNTER — Emergency Department (HOSPITAL_COMMUNITY): Payer: BC Managed Care – PPO

## 2016-03-03 ENCOUNTER — Emergency Department (HOSPITAL_COMMUNITY)
Admission: EM | Admit: 2016-03-03 | Discharge: 2016-03-03 | Disposition: A | Discharge status: Home / Self Care | Payer: BC Managed Care – PPO | Attending: Emergency Medicine | Admitting: Emergency Medicine

## 2016-03-03 ENCOUNTER — Encounter (HOSPITAL_COMMUNITY): Payer: Self-pay

## 2016-03-03 DIAGNOSIS — K8020 Calculus of gallbladder without cholecystitis without obstruction: Secondary | ICD-10-CM | POA: Diagnosis not present

## 2016-03-03 DIAGNOSIS — Z87891 Personal history of nicotine dependence: Secondary | ICD-10-CM | POA: Diagnosis not present

## 2016-03-03 DIAGNOSIS — R1011 Right upper quadrant pain: Secondary | ICD-10-CM | POA: Diagnosis present

## 2016-03-03 DIAGNOSIS — J45909 Unspecified asthma, uncomplicated: Secondary | ICD-10-CM | POA: Diagnosis not present

## 2016-03-03 HISTORY — DX: Calculus of gallbladder without cholecystitis without obstruction: K80.20

## 2016-03-03 LAB — COMPREHENSIVE METABOLIC PANEL
ALK PHOS: 74 U/L (ref 38–126)
ALT: 15 U/L (ref 14–54)
ANION GAP: 8 (ref 5–15)
AST: 16 U/L (ref 15–41)
Albumin: 4 g/dL (ref 3.5–5.0)
BILIRUBIN TOTAL: 0.7 mg/dL (ref 0.3–1.2)
BUN: 12 mg/dL (ref 6–20)
CO2: 22 mmol/L (ref 22–32)
Calcium: 9.1 mg/dL (ref 8.9–10.3)
Chloride: 105 mmol/L (ref 101–111)
Creatinine, Ser: 0.77 mg/dL (ref 0.44–1.00)
GFR calc Af Amer: >60 mL/min (ref >60)
Glucose, Bld: 95 mg/dL (ref 65–99)
Potassium: 3.7 mmol/L (ref 3.5–5.1)
Sodium: 135 mmol/L (ref 135–145)
TOTAL PROTEIN: 7 g/dL (ref 6.5–8.1)

## 2016-03-03 LAB — LIPASE, BLOOD: Lipase: 24 U/L (ref 11–51)

## 2016-03-03 LAB — CBC
HCT: 42.5 % (ref 36.0–46.0)
HEMOGLOBIN: 14.4 g/dL (ref 12.0–15.0)
MCH: 29.5 pg (ref 26.0–34.0)
MCHC: 33.9 g/dL (ref 30.0–36.0)
MCV: 87.1 fL (ref 78.0–100.0)
Platelets: 219 10*3/uL (ref 150–400)
RBC: 4.88 MIL/uL (ref 3.87–5.11)
RDW: 12.1 % (ref 11.5–15.5)
WBC: 7.1 10*3/uL (ref 4.0–10.5)

## 2016-03-03 LAB — I-STAT BETA HCG BLOOD, ED (MC, WL, AP ONLY)

## 2016-03-03 LAB — URINE MICROSCOPIC-ADD ON: RBC / HPF: NONE SEEN RBC/hpf (ref 0–5)

## 2016-03-03 LAB — URINALYSIS, ROUTINE W REFLEX MICROSCOPIC
Bilirubin Urine: NEGATIVE
Glucose, UA: NEGATIVE mg/dL
Ketones, ur: NEGATIVE mg/dL
Leukocytes, UA: NEGATIVE
NITRITE: NEGATIVE
Protein, ur: NEGATIVE mg/dL
SPECIFIC GRAVITY, URINE: 1.023 (ref 1.005–1.030)
pH: 5 (ref 5.0–8.0)

## 2016-03-03 MED ORDER — ONDANSETRON 4 MG PO TBDP: 4.0000 mg | ORAL_TABLET | Freq: Three times a day (TID) | ORAL | 0 refills | Status: DC | PRN

## 2016-03-03 MED ORDER — SODIUM CHLORIDE 0.9 % IV BOLUS (SEPSIS)
1000.0000 mL | Freq: Once | INTRAVENOUS | Status: AC
  Administered 2016-03-03: 1000 mL via INTRAVENOUS

## 2016-03-03 MED ORDER — ONDANSETRON HCL 4 MG/2ML IJ SOLN
4.0000 mg | Freq: Once | INTRAMUSCULAR | Status: AC
  Administered 2016-03-03: 4 mg via INTRAVENOUS
  Filled 2016-03-03: qty 2

## 2016-03-03 MED ORDER — MORPHINE SULFATE (PF) 4 MG/ML IV SOLN
6.0000 mg | Freq: Once | INTRAVENOUS | Status: AC
  Administered 2016-03-03: 6 mg via INTRAVENOUS
  Filled 2016-03-03: qty 2

## 2016-03-03 NOTE — ED Provider Notes (Deleted)
35 year old female who has a diagnosis of gallstones comes in with right upper quadrant pain. On exam, there is marked tenderness in the right upper quadrant. She was sent for right upper quadrant ultrasound which showed cholelithiasis without cholecystitis. She will be referred to general surgery for elective cholecystectomy.  I saw and evaluated the patient, reviewed the resident's note and I agree with the findings and plan.     Dione Booze, MD 03/03/16 2121

## 2016-03-03 NOTE — ED Triage Notes (Signed)
Onset several days RUQ abd pain, nausea. Vomited x 1 yesterday.  Pt has h/o gallstones but this feels different than usual pain with gallstone.

## 2016-03-03 NOTE — ED Provider Notes (Signed)
[facility]-EMERGENCY DEPT Provider Note   CSN: [id number] Arrival date & time: 03/03/16  1615  First Provider Contact:  First MD Initiated Contact with Patient 03/03/16 1951     History   Chief Complaint Chief Complaint  Patient presents with  . Abdominal Pain    HPI Dana Frost is a 35 y.o. female.  The history is provided by the patient. No language interpreter was used.  Abdominal Pain   This is a recurrent problem. Episode onset: 8 months ago, worsening in severity. The problem has been gradually worsening. The pain is associated with eating. The pain is located in the RUQ, epigastric region, suprapubic region, LUQ and LLQ. The pain is at a severity of 7/10. The pain is moderate. Associated symptoms include anorexia and nausea. Pertinent negatives include fever, diarrhea, flatus, hematochezia, melena, vomiting, constipation, dysuria, frequency, hematuria, headaches, arthralgias and myalgias. The symptoms are aggravated by eating. Past workup includes ultrasound. Past workup does not include surgery. Her past medical history is significant for gallstones.    Past Medical History:  Diagnosis Date  . Anxiety   . Asthma   . Depression   . Gallstone   . H/O hearing loss    90%left, 10%right  . Headache(784.0)   . History of panic attacks    after death of mother  . HSV (herpes simplex virus) infection     Patient Active Problem List   Diagnosis Date Noted  . Labor and delivery, indication for care 04/18/2014  . Postpartum state 04/18/2014  . H/O hearing loss     Past Surgical History:  Procedure Laterality Date  . ANKLE RECONSTRUCTION    . UPPER GASTROINTESTINAL ENDOSCOPY      OB History    Gravida Para Term Preterm AB Living   4 2 2  0 2 2   SAB TAB Ectopic Multiple Live Births   1 1 0 0 2       Home Medications    Prior to Admission medications   Medication Sig Start Date End Date Taking? Authorizing Provider  acetaminophen (TYLENOL) 500 MG tablet Take 1,000 mg  by mouth every 6 (six) hours as needed for headache.    Historical Provider, MD  albuterol (PROVENTIL HFA;VENTOLIN HFA) 108 (90 BASE) MCG/ACT inhaler Inhale 2 puffs into the lungs every 6 (six) hours as needed for wheezing or shortness of breath.    Historical Provider, MD  ondansetron (ZOFRAN ODT) 4 MG disintegrating tablet Take 1 tablet (4 mg total) by mouth every 8 (eight) hours as needed for nausea or vomiting. 03/03/16   Dan Humphreys, MD  Prenatal Vit-Fe Fumarate-FA (PRENATAL VITAMIN PO) Take 1 tablet by mouth daily.    Historical Provider, MD    Family History Family History  Problem Relation Age of Onset  . Hypertension Father   . Hyperlipidemia Father   . Diabetes Father     Social History Social History  Substance Use Topics  . Smoking status: Former Smoker    Types: Cigarettes  . Smokeless tobacco: Never Used  . Alcohol use 4.2 oz/week    7 Glasses of wine per week     Allergies   Penicillins   Review of Systems Review of Systems  Constitutional: Negative for chills and fever.  HENT: Negative for ear pain and sore throat.   Eyes: Negative for pain and visual disturbance.  Respiratory: Negative for cough and shortness of breath.   Cardiovascular: Negative for chest pain and palpitations.  Gastrointestinal: Positive for abdominal pain, anorexia  and nausea. Negative for constipation, diarrhea, flatus, hematochezia, melena and vomiting.  Genitourinary: Negative for dysuria, frequency and hematuria.  Musculoskeletal: Negative for arthralgias, back pain and myalgias.  Skin: Negative for color change and rash.  Neurological: Negative for seizures, syncope and headaches.  All other systems reviewed and are negative.    Physical Exam Updated Vital Signs BP 120/68   Pulse 66   Temp 98 F (36.7 C) (Oral)   Resp 20   Ht 5\' 7"  (1.702 m)   Wt 105.7 kg   LMP 03/03/2016   SpO2 96%   BMI 36.49 kg/m   Physical Exam  Constitutional: She appears well-developed and  well-nourished. No distress.  HENT:  Head: Normocephalic and atraumatic.  Eyes: Conjunctivae are normal.  Neck: Neck supple.  Cardiovascular: Normal rate and regular rhythm.   No murmur heard. Pulmonary/Chest: Effort normal and breath sounds normal. No respiratory distress.  Abdominal: Soft. There is tenderness in the right upper quadrant. There is positive Murphy's sign. There is no rigidity, no rebound, no guarding, no CVA tenderness and no tenderness at McBurney's point.  Musculoskeletal: She exhibits no edema.  Neurological: She is alert.  Skin: Skin is warm and dry.  Psychiatric: She has a normal mood and affect.  Nursing note and vitals reviewed.    ED Treatments / Results  Labs (all labs ordered are listed, but only abnormal results are displayed) Labs Reviewed  URINALYSIS, ROUTINE W REFLEX MICROSCOPIC (NOT AT [facility]) - Abnormal; Notable for the following:       Result Value   Hgb urine dipstick SMALL (*)    All other components within normal limits  URINE MICROSCOPIC-ADD ON - Abnormal; Notable for the following:    Squamous Epithelial / LPF 0-5 (*)    Bacteria, UA RARE (*)    All other components within normal limits  LIPASE, BLOOD  COMPREHENSIVE METABOLIC PANEL  CBC  I-STAT BETA HCG BLOOD, ED ([facility], [facility], [facility] ONLY)    EKG  EKG Interpretation None       Radiology US Abdomen Limited Ruq  Result Date: 03/03/2016 CLINICAL DATA:  Patient with right upper quadrant pain. Nausea vomiting and diarrhea. EXAM: US ABDOMEN LIMITED - RIGHT UPPER QUADRANT COMPARISON:  CT abdomen pelvis 09/30/2015 FINDINGS: Gallbladder: Large amount of gallstones demonstrated within the gallbladder lumen. No gallbladder wall thickening. Negative sonographic Murphy's sign. No pericholecystic fluid. Common bile duct: Diameter: 2 mm Liver: No focal lesion identified. Within normal limits in parenchymal echogenicity. IMPRESSION: Cholelithiasis without sonographic evidence for acute cholecystitis.  Electronically Signed   By: Annia Belt M.D.   On: 03/03/2016 20:55    Procedures Procedures (including critical care time)  Medications Ordered in ED Medications  morphine 4 MG/ML injection 6 mg (6 mg Intravenous Given 03/03/16 2023)  ondansetron (ZOFRAN) injection 4 mg (4 mg Intravenous Given 03/03/16 2023)  sodium chloride 0.9 % bolus 1,000 mL (0 mLs Intravenous Stopped 03/03/16 2136)     Initial Impression / Assessment and Plan / ED Course  I have reviewed the triage vital signs and the nursing notes.  Pertinent labs & imaging results that were available during my care of the patient were reviewed by me and considered in my medical decision making (see chart for details).  Clinical Course    Patient is a 35 year old female with known history of gallbladder sludge presents via private vehicle for evaluation treatment of right upper quadrant pain, nausea, vomiting. She denies any fevers, diarrhea. She has not yet been evaluated by  a Careers adviser.  Vital signs without fever. Patient overall well appearing. She is significant tenderness palpation in the right upper quadrant, positive Murphy's sign.  Concern for cholecystitis versus symptomatic cholelithiasis. Laboratory studies reviewed, and normal white blood cell count, normal liver enzymes. Pregnancy test negative, lipase normal.  Patient given morphine for pain, Zofran for nausea, normal saline bolus. This improved patient's symptoms. Right upper quadrant ultrasound performed which showed large amount of gallstones without acute cholecystitis.  I encouraged patient to follow-up with the general surgeon for elective outpatient cholecystectomy should she decide to do so.  Patient ambulatory in no acute distress at time of discharge. Encouraged patient to avoid fatty foods as this may exacerbate her symptoms.  I discussed case with my attending, Dr. Preston Fleeting.  Final Clinical Impressions(s) / ED Diagnoses   Final diagnoses:  RUQ pain    Calculus of gallbladder without cholecystitis without obstruction    New Prescriptions Discharge Medication List as of 03/03/2016  9:27 PM    START taking these medications   Details  ondansetron (ZOFRAN ODT) 4 MG disintegrating tablet Take 1 tablet (4 mg total) by mouth every 8 (eight) hours as needed for nausea or vomiting., Starting Sat 03/03/2016, Print         Dan Humphreys, MD 03/03/16 2358    Dione Booze, MD 03/04/16 [phone]

## 2016-03-03 NOTE — ED Notes (Signed)
Provider at bedside

## 2016-03-03 NOTE — ED Notes (Signed)
Patient left at this time with all belongings. 

## 2017-04-30 ENCOUNTER — Encounter: Payer: Self-pay | Admitting: Neurology

## 2017-04-30 ENCOUNTER — Ambulatory Visit (INDEPENDENT_AMBULATORY_CARE_PROVIDER_SITE_OTHER): Payer: BC Managed Care – PPO | Admitting: Neurology

## 2017-04-30 VITALS — BP 117/83 | HR 84 | Ht 67.0 in | Wt 260.0 lb

## 2017-04-30 DIAGNOSIS — R51 Headache: Secondary | ICD-10-CM | POA: Diagnosis not present

## 2017-04-30 DIAGNOSIS — G932 Benign intracranial hypertension: Secondary | ICD-10-CM | POA: Diagnosis not present

## 2017-04-30 DIAGNOSIS — G43709 Chronic migraine without aura, not intractable, without status migrainosus: Secondary | ICD-10-CM

## 2017-04-30 DIAGNOSIS — IMO0002 Reserved for concepts with insufficient information to code with codable children: Secondary | ICD-10-CM | POA: Insufficient documentation

## 2017-04-30 DIAGNOSIS — R519 Headache, unspecified: Secondary | ICD-10-CM

## 2017-04-30 MED ORDER — ONDANSETRON 4 MG PO TBDP: 4.0000 mg | ORAL_TABLET | Freq: Three times a day (TID) | ORAL | 11 refills | Status: DC | PRN

## 2017-04-30 MED ORDER — RIZATRIPTAN BENZOATE 10 MG PO TBDP: 10.0000 mg | ORAL_TABLET | ORAL | 11 refills | Status: DC | PRN

## 2017-04-30 MED ORDER — TOPIRAMATE 100 MG PO TABS: 100.0000 mg | ORAL_TABLET | Freq: Two times a day (BID) | ORAL | 11 refills | Status: DC

## 2017-04-30 NOTE — Progress Notes (Signed)
PATIENT: Dana Frost DOB: [birth year]  Chief Complaint  Patient presents with  . Headache    Reports history of pailledema.  She has previously been evaluated by Dr. Gentry Roch at [facility] in 2009.  States she had a normal MRI and LP with a high opening pressure.  She was placed on both Diamox and Topamax but eventually stopped both medications, on her own, because her headaches had improved.  She was lost to follow up.  She has recently had an abnormal eye exam.  She is currenlty having near daily headaches in varying degrees of pain.  She takes ibuprofen occasionally without relief.  [facility]    Marzella Schlein., MD  . PCP    Piedad Climes, [place], PA-C     HISTORICAL  Dana Frost is a 36 year old female seen in refer by ophthalmologist Dr. Velna Ochs for evaluation of bilateral papillary edema, history of pseudotumor cerebri, primary care physician is [place], [place], initial evaluation was May 01 2017.  I have reviewed and summarized the most recent ophthalmology evaluation by Dr. Lucretia Roers on March 12 2017, there was evidence of bilateral papillary edema, she did have a history of pseudotumor cerebri, this was diagnosed in 2009, shortly after her child was prolonged, she began to develop frequent headaches, whooshing sound at the left occipital region, and also transient vision blackout, she had lumbar puncture, reported 4 times of normal range, but she could not remember exact number, lumbar puncture has helped her headache,  She was also treated with Diamox, Topamax, was followed by [facility] Dr. Gentry Roch for a while, she was headache free from 2009 to 2015, was able to taper off Topamax and Diamox,  By 2015, after second child, she began to develop headaches again, gradually getting worse, since February 2018, he become almost daily bases, similar to the symptoms she had when she was diagnosed with pseudotumor cerebri, also had whooshing sound at the left  occipital region, with no visual change.  She has pneumonia in September 2018, CT chest in September 2018 at [facility] mild pneumonia in the lingular and right lower lobe, indeterminate right thyroid nodule  Lab in 2018: normal or negative ANA, RPR, CMP, LDL 91, Triglycerides 68, LDL 91.    REVIEW OF SYSTEMS: Full 14 system review of systems performed and notable only for as above  ALLERGIES: Allergies  Allergen Reactions  . Penicillins Hives    HOME MEDICATIONS: Current Outpatient Prescriptions  Medication Sig Dispense Refill  . Multiple Vitamins-Minerals (MULTIVITAMIN PO) Take by mouth daily.    [facility] albuterol (PROVENTIL HFA;VENTOLIN HFA) 108 (90 BASE) MCG/ACT inhaler Inhale 2 puffs into the lungs every 6 (six) hours as needed for wheezing or shortness of breath.     No current facility-administered medications for this visit.     PAST MEDICAL HISTORY: Past Medical History:  Diagnosis Date  . Anxiety   . Asthma   . Depression   . Gallstone   . H/O hearing loss    90%left, 10%right  . Headache(784.0)   . History of panic attacks    after death of mother  . Migraine   . Papilledema     PAST SURGICAL HISTORY: Past Surgical History:  Procedure Laterality Date  . ANKLE RECONSTRUCTION Left    x 2  . UPPER GASTROINTESTINAL ENDOSCOPY      FAMILY HISTORY: Family History  Problem Relation Age of Onset  . Other Mother        Dana Frost  of history - thinks she may have had cancer  . Hypertension Father   . Hyperlipidemia Father   . Diabetes Father   . Liver disease Father     SOCIAL HISTORY:  Social History   Social History  . Marital status: Single    Spouse name: N/A  . Number of children: 2  . Years of education: Masters   Occupational History  . Teacher    Social History Main Topics  . Smoking status: Former Smoker    Types: Cigarettes  . Smokeless tobacco: Never Used  . Alcohol use 4.2 oz/week    7 Glasses of wine per week  . Drug use: No  .  Sexual activity: Not Currently    Birth control/ protection: None     Comment: last sex yesterday   Other Topics Concern  . Not on file   Social History Narrative   Lives at home with husband, two children and her mother-in-law.   Right-handed.   2 cups caffeine per day, occasional soda.     PHYSICAL EXAM   Vitals:   04/30/17 1347  BP: 117/83  Pulse: 84  Weight: 260 lb (117.9 kg)  Height:  (1.702 m)    Not recorded      Body mass index is 40.72 kg/m.  PHYSICAL EXAMNIATION:  Gen: NAD, conversant, well nourised, obese, well groomed                     Cardiovascular: Regular rate rhythm, no peripheral edema, warm, nontender. Eyes: Conjunctivae clear without exudates or hemorrhage Neck: Supple, no carotid bruits. Pulmonary: Clear to auscultation bilaterally   NEUROLOGICAL EXAM:  MENTAL STATUS: Speech:    Speech is normal; fluent and spontaneous with normal comprehension.  Cognition:     Orientation to time, place and person     Normal recent and remote memory     Normal Attention span and concentration     Normal Language, naming, repeating,spontaneous speech     Fund of knowledge   CRANIAL NERVES: CN II: Visual fields are full to confrontation. Fundoscopic showed blurry edge, no venous pulsation noted,  Pupils are round equal and briskly reactive to light. CN III, IV, VI: extraocular movement are normal. No ptosis. CN V: Facial sensation is intact to pinprick in all 3 divisions bilaterally. Corneal responses are intact.  CN VII: Face is symmetric with normal eye closure and smile. CN VIII: Hearing is normal to rubbing fingers CN IX, X: Palate elevates symmetrically. Phonation is normal. CN XI: Head turning and shoulder shrug are intact CN XII: Tongue is midline with normal movements and no atrophy.  MOTOR: There is no pronator drift of out-stretched arms. Muscle bulk and tone are normal. Muscle strength is normal.  REFLEXES: Reflexes are 2+ and  symmetric at the biceps, triceps, knees, and ankles. Plantar responses are flexor.  SENSORY: Intact to light touch, pinprick, positional sensation and vibratory sensation are intact in fingers and toes.  COORDINATION: Rapid alternating movements and fine finger movements are intact. There is no dysmetria on finger-to-nose and heel-knee-shin.    GAIT/STANCE: Posture is normal. Gait is steady with normal steps, base, arm swing, and turning. Heel and toe walking are normal. Tandem gait is normal.  Romberg is absent.   DIAGNOSTIC DATA (LABS, IMAGING, TESTING) - I reviewed patient records, labs, notes, testing and imaging myself where available.   ASSESSMENT AND PLAN  Mckenze Slone is a 36 y.o. female   History of pseudotumor  cerebri, evidence of mild bilateral papillary edema Chronic migraine headaches  MRI of the brain without contrast  Topamax 100 mg twice a day  Maxalt as needed  Zofran as needed   Levert Feinstein, M.D. Ph.D.  [facility] [address] [place], [place] [address] Ph: [phone] Fax: [phone]  CC: Marzella Schlein., MD, [place], [place], [place]

## 2017-04-30 NOTE — Patient Instructions (Signed)
You may take Maxalt Together with Zofran Aleve  As needed during a prolonged severe headache

## 2017-05-08 ENCOUNTER — Ambulatory Visit (INDEPENDENT_AMBULATORY_CARE_PROVIDER_SITE_OTHER): Payer: BC Managed Care – PPO

## 2017-05-08 ENCOUNTER — Telehealth: Payer: Self-pay | Admitting: Neurology

## 2017-05-08 ENCOUNTER — Other Ambulatory Visit: Payer: BC Managed Care – PPO

## 2017-05-08 DIAGNOSIS — R519 Headache, unspecified: Secondary | ICD-10-CM

## 2017-05-08 DIAGNOSIS — R51 Headache: Secondary | ICD-10-CM

## 2017-05-08 NOTE — Telephone Encounter (Signed)
Left message requesting a return call.

## 2017-05-08 NOTE — Telephone Encounter (Signed)
Patient here today for MRI. States she has a "severe reaction" to Topamax last week and would like a call back. Best number [phone]

## 2017-05-08 NOTE — Telephone Encounter (Signed)
Left another message requesting a return call. 

## 2017-05-13 ENCOUNTER — Encounter: Payer: Self-pay | Admitting: *Deleted

## 2017-05-13 NOTE — Telephone Encounter (Signed)
Left third message for patient to call our office.  Unable to contact letter mailed to home address.

## 2017-05-13 NOTE — Telephone Encounter (Signed)
Closing encounter

## 2017-05-14 ENCOUNTER — Telehealth: Payer: Self-pay

## 2017-05-14 NOTE — Telephone Encounter (Signed)
I left a detailed message with normal MRI brain results, ok per dpr. I advised patient to call back with any questions or concerns.

## 2017-05-14 NOTE — Telephone Encounter (Signed)
-----   Message from Geronimo Running, RN sent at 05/13/2017  5:31 PM EDT -----   ----- Message ----- From: Levert Feinstein, MD Sent: 05/09/2017  11:48 AM To: Geronimo Running, RN  Please call pt for normal MRI brain.

## 2017-07-01 ENCOUNTER — Telehealth: Payer: Self-pay | Admitting: *Deleted

## 2017-07-01 ENCOUNTER — Ambulatory Visit: Payer: BC Managed Care – PPO | Admitting: Neurology

## 2017-07-01 NOTE — Telephone Encounter (Signed)
No showed follow up appointment. 

## 2017-07-02 ENCOUNTER — Encounter: Payer: Self-pay | Admitting: Neurology

## 2019-03-19 ENCOUNTER — Emergency Department (HOSPITAL_COMMUNITY)
Admission: EM | Admit: 2019-03-19 | Discharge: 2019-03-20 | Disposition: A | Discharge status: Other Acute Inpatient Hospital | Payer: BC Managed Care – PPO | Source: Home / Self Care | Attending: Emergency Medicine | Admitting: Emergency Medicine

## 2019-03-19 ENCOUNTER — Encounter (HOSPITAL_COMMUNITY): Payer: Self-pay | Admitting: Emergency Medicine

## 2019-03-19 ENCOUNTER — Other Ambulatory Visit: Payer: Self-pay

## 2019-03-19 DIAGNOSIS — F322 Major depressive disorder, single episode, severe without psychotic features: Secondary | ICD-10-CM

## 2019-03-19 DIAGNOSIS — Z046 Encounter for general psychiatric examination, requested by authority: Secondary | ICD-10-CM | POA: Insufficient documentation

## 2019-03-19 DIAGNOSIS — J45909 Unspecified asthma, uncomplicated: Secondary | ICD-10-CM | POA: Insufficient documentation

## 2019-03-19 DIAGNOSIS — F332 Major depressive disorder, recurrent severe without psychotic features: Secondary | ICD-10-CM | POA: Insufficient documentation

## 2019-03-19 DIAGNOSIS — Z20828 Contact with and (suspected) exposure to other viral communicable diseases: Secondary | ICD-10-CM | POA: Insufficient documentation

## 2019-03-19 DIAGNOSIS — Z87891 Personal history of nicotine dependence: Secondary | ICD-10-CM | POA: Insufficient documentation

## 2019-03-19 DIAGNOSIS — Z79899 Other long term (current) drug therapy: Secondary | ICD-10-CM | POA: Insufficient documentation

## 2019-03-19 DIAGNOSIS — R45851 Suicidal ideations: Secondary | ICD-10-CM | POA: Insufficient documentation

## 2019-03-19 LAB — COMPREHENSIVE METABOLIC PANEL
ALT: 26 U/L (ref 0–44)
AST: 19 U/L (ref 15–41)
Albumin: 4.4 g/dL (ref 3.5–5.0)
Alkaline Phosphatase: 75 U/L (ref 38–126)
Anion gap: 10 (ref 5–15)
BUN: 20 mg/dL (ref 6–20)
CO2: 22 mmol/L (ref 22–32)
Calcium: 9.5 mg/dL (ref 8.9–10.3)
Chloride: 106 mmol/L (ref 98–111)
Creatinine, Ser: 0.96 mg/dL (ref 0.44–1.00)
GFR calc Af Amer: >60 mL/min (ref >60)
GFR calc non Af Amer: >60 mL/min (ref >60)
Glucose, Bld: 126 mg/dL — ABNORMAL HIGH (ref 70–99)
Potassium: 3.7 mmol/L (ref 3.5–5.1)
Sodium: 138 mmol/L (ref 135–145)
Total Bilirubin: 0.4 mg/dL (ref 0.3–1.2)
Total Protein: 7.9 g/dL (ref 6.5–8.1)

## 2019-03-19 LAB — CBC
HCT: 45.3 % (ref 36.0–46.0)
Hemoglobin: 14.7 g/dL (ref 12.0–15.0)
MCH: 29.5 pg (ref 26.0–34.0)
MCHC: 32.5 g/dL (ref 30.0–36.0)
MCV: 90.8 fL (ref 80.0–100.0)
Platelets: 225 10*3/uL (ref 150–400)
RBC: 4.99 MIL/uL (ref 3.87–5.11)
RDW: 12.5 % (ref 11.5–15.5)
WBC: 10.3 10*3/uL (ref 4.0–10.5)
nRBC: 0 % (ref 0.0–0.2)

## 2019-03-19 LAB — RAPID URINE DRUG SCREEN, HOSP PERFORMED
Amphetamines: NOT DETECTED
Barbiturates: NOT DETECTED
Benzodiazepines: NOT DETECTED
Cocaine: NOT DETECTED
Opiates: NOT DETECTED
Tetrahydrocannabinol: POSITIVE — AB

## 2019-03-19 LAB — ETHANOL: Alcohol, Ethyl (B): <10 mg/dL (ref <10)

## 2019-03-19 LAB — ACETAMINOPHEN LEVEL: Acetaminophen (Tylenol), Serum: <10 ug/mL — ABNORMAL LOW (ref 10–30)

## 2019-03-19 LAB — I-STAT BETA HCG BLOOD, ED (MC, WL, AP ONLY): I-stat hCG, quantitative: <5 m[IU]/mL (ref <5)

## 2019-03-19 LAB — SALICYLATE LEVEL: Salicylate Lvl: <7 mg/dL (ref 2.8–30.0)

## 2019-03-19 NOTE — ED Triage Notes (Signed)
Patient was trying to kill herself by trying to cut herself with a knife, she was trying to get into lock box to get her husband gun, and jumped out a window. Patient has no wounds. Patient states she wants to die.

## 2019-03-19 NOTE — ED Notes (Signed)
Pt dressed out into maroon scrubs.Pt belongings were placed in a bag and put behind the triage desk.

## 2019-03-19 NOTE — ED Provider Notes (Signed)
[facility]-EMERGENCY DEPT Provider Note   CSN: [id number] Arrival date & time: 03/19/19  2210     History   Chief Complaint Chief Complaint  Patient presents with  . Suicidal  . IVC    HPI Dana Frost is a 38 y.o. female.     38 year old female with a history of depression and anxiety presents to the emergency department with [employer] over concern for suicidal ideations.  She was trying to cut herself with a knife as well as obtain her husband's handgun in the lock box.  Reportedly jumped out of a window prior to arrival.  Admitted to police that she wanted to die.  [employer] took out IVC papers shortly after arrival to the emergency department.  The patient has not very forthcoming with regards to the events of this evening.  States that her "rights have been taken away" and it's her "right to talk about what happened" and "I don't want to".  Does deny HI, ETOH use, drug use.  The history is provided by the patient. No language interpreter was used.    Past Medical History:  Diagnosis Date  . Anxiety   . Asthma   . Depression   . Gallstone   . H/O hearing loss    90%left, 10%right  . Headache(784.0)   . History of panic attacks    after death of mother  . Migraine   . Papilledema     Patient Active Problem List   Diagnosis Date Noted  . Chronic migraine 04/30/2017  . Pseudotumor cerebri 04/30/2017  . Labor and delivery, indication for care 04/18/2014  . Postpartum state 04/18/2014  . H/O hearing loss     Past Surgical History:  Procedure Laterality Date  . ANKLE RECONSTRUCTION Left    x 2  . UPPER GASTROINTESTINAL ENDOSCOPY       OB History    Gravida  4   Para  2   Term  2   Preterm  0   AB  2   Living  2     SAB  1   TAB  1   Ectopic  0   Multiple  0   Live Births  2            Home Medications    Prior to Admission medications   Medication Sig Start Date End Date Taking? Authorizing Provider  Multiple  Vitamins-Minerals (MULTI ADULT GUMMIES PO) Take 1 each by mouth daily.   Yes [provider]  ondansetron (ZOFRAN ODT) 4 MG disintegrating tablet Take 1 tablet (4 mg total) by mouth every 8 (eight) hours as needed. Patient not taking: Reported on 03/19/2019 04/30/17   Levert FeinsteinYan, Yijun, MD  rizatriptan (MAXALT-MLT) 10 MG disintegrating tablet Take 1 tablet (10 mg total) by mouth as needed for migraine. May repeat in 2 hours if needed Patient not taking: Reported on 03/19/2019 04/30/17   Levert FeinsteinYan, Yijun, MD  topiramate (TOPAMAX) 100 MG tablet Take 1 tablet (100 mg total) by mouth 2 (two) times daily. Patient not taking: Reported on 03/19/2019 04/30/17   Levert FeinsteinYan, Yijun, MD    Family History Family History  Problem Relation Age of Onset  . Other Mother        Posey ReaUnsure of history - thinks she may have had cancer  . Hypertension Father   . Hyperlipidemia Father   . Diabetes Father   . Liver disease Father     Social History Social History   Tobacco Use  .  Smoking status: Former Smoker    Types: Cigarettes  . Smokeless tobacco: Never Used  Substance Use Topics  . Alcohol use: Yes    Alcohol/week: 7.0 standard drinks    Types: 7 Glasses of wine per week  . Drug use: No     Allergies   Penicillins   Review of Systems Review of Systems Ten systems reviewed and are negative for acute change, except as noted in the HPI.    Physical Exam Updated Vital Signs BP (!) 134/98 (BP Location: Left Arm)   Pulse 94   Temp 98.6 F (37 C) (Oral)   Resp 18   Ht 5\' 7"  (1.702 m)   Wt 117.9 kg   SpO2 100%   BMI 40.72 kg/m   Physical Exam Vitals signs and nursing note reviewed.  Constitutional:      General: She is not in acute distress.    Appearance: She is well-developed. She is not diaphoretic.     Comments: Nontoxic appearing and in NAD  HENT:     Head: Normocephalic and atraumatic.  Eyes:     General: No scleral icterus.    Conjunctiva/sclera: Conjunctivae normal.  Neck:      Musculoskeletal: Normal range of motion.  Pulmonary:     Effort: Pulmonary effort is normal. No respiratory distress.     Comments: Respirations even and unlabored Musculoskeletal: Normal range of motion.  Skin:    General: Skin is warm and dry.     Coloration: Skin is not pale.     Findings: No erythema or rash.  Neurological:     Mental Status: She is alert and oriented to person, place, and time.     Coordination: Coordination normal.  Psychiatric:        Attention and Perception: Attention normal.        Mood and Affect: Affect is blunt.        Speech: Speech normal.        Behavior: Behavior is agitated and withdrawn.        Thought Content: Thought content does not include homicidal ideation. Thought content does not include homicidal plan.     Comments: Will not disclose SI or plan.      ED Treatments / Results  Labs (all labs ordered are listed, but only abnormal results are displayed) Labs Reviewed  COMPREHENSIVE METABOLIC PANEL - Abnormal; Notable for the following components:      Result Value   Glucose, Bld 126 (*)    All other components within normal limits  ACETAMINOPHEN LEVEL - Abnormal; Notable for the following components:   Acetaminophen (Tylenol), Serum <10 (*)    All other components within normal limits  RAPID URINE DRUG SCREEN, HOSP PERFORMED - Abnormal; Notable for the following components:   Tetrahydrocannabinol POSITIVE (*)    All other components within normal limits  SARS CORONAVIRUS 2 (HOSPITAL ORDER, [place] [facility])  ETHANOL  SALICYLATE LEVEL  CBC  I-STAT BETA HCG BLOOD, ED ([facility], [facility], [facility] ONLY)    EKG None  Radiology No results found.  Procedures Procedures (including critical care time)  Medications Ordered in ED Medications - No data to display   Initial Impression / Assessment and Plan / ED Course  I have reviewed the triage vital signs and the nursing notes.  Pertinent labs & imaging results that were  available during my care of the patient were reviewed by me and considered in my medical decision making (see chart for details).  38 year old female presents to the emergency department under IVC taken out by police.  Reporting suicidal ideations with attempt to stab herself with a knife prior to arrival.  There was also concern that patient was trying to access her husband's gun.  Patient medically cleared and has been assessed by TTS who recommend admission [facility].  Disposition to be set by oncoming ED provider.    Final Clinical Impressions(s) / ED Diagnoses   Final diagnoses:  Current severe episode of major depressive disorder without psychotic features, unspecified whether recurrent [facility](HCC)    ED Discharge Orders    None       Antony MaduraHumes, Ermie Glendenning, PA-C 03/20/19 0444    Nira Connardama, Pedro Eduardo, MD 03/20/19 [phone]

## 2019-03-20 ENCOUNTER — Encounter (HOSPITAL_COMMUNITY): Payer: Self-pay

## 2019-03-20 ENCOUNTER — Inpatient Hospital Stay (HOSPITAL_COMMUNITY)
Admission: EM | Admit: 2019-03-20 | Discharge: 2019-03-23 | DRG: 885 | Disposition: A | Discharge status: Home / Self Care | Payer: BC Managed Care – PPO | Attending: Psychiatry | Admitting: Psychiatry

## 2019-03-20 DIAGNOSIS — G4700 Insomnia, unspecified: Secondary | ICD-10-CM | POA: Diagnosis present

## 2019-03-20 DIAGNOSIS — Z20828 Contact with and (suspected) exposure to other viral communicable diseases: Secondary | ICD-10-CM | POA: Diagnosis present

## 2019-03-20 DIAGNOSIS — Z818 Family history of other mental and behavioral disorders: Secondary | ICD-10-CM

## 2019-03-20 DIAGNOSIS — Z915 Personal history of self-harm: Secondary | ICD-10-CM | POA: Diagnosis not present

## 2019-03-20 DIAGNOSIS — J45909 Unspecified asthma, uncomplicated: Secondary | ICD-10-CM | POA: Diagnosis present

## 2019-03-20 DIAGNOSIS — Z87891 Personal history of nicotine dependence: Secondary | ICD-10-CM | POA: Diagnosis not present

## 2019-03-20 DIAGNOSIS — F332 Major depressive disorder, recurrent severe without psychotic features: Secondary | ICD-10-CM | POA: Diagnosis present

## 2019-03-20 DIAGNOSIS — F410 Panic disorder [episodic paroxysmal anxiety] without agoraphobia: Secondary | ICD-10-CM | POA: Diagnosis present

## 2019-03-20 DIAGNOSIS — F1210 Cannabis abuse, uncomplicated: Secondary | ICD-10-CM | POA: Diagnosis present

## 2019-03-20 LAB — SARS CORONAVIRUS 2 BY RT PCR (HOSPITAL ORDER, PERFORMED IN ~~LOC~~ HOSPITAL LAB): SARS Coronavirus 2: NEGATIVE

## 2019-03-20 MED ORDER — MAGNESIUM HYDROXIDE 400 MG/5ML PO SUSP: 30.0000 mL | Freq: Every day | ORAL | Status: DC | PRN

## 2019-03-20 MED ORDER — ACETAMINOPHEN 325 MG PO TABS
650.0000 mg | ORAL_TABLET | Freq: Four times a day (QID) | ORAL | Status: DC | PRN
  Administered 2019-03-20 – 2019-03-22 (×3): 650 mg via ORAL
  Filled 2019-03-20 (×3): qty 2

## 2019-03-20 MED ORDER — ESCITALOPRAM OXALATE 5 MG PO TABS
5.0000 mg | ORAL_TABLET | Freq: Every day | ORAL | Status: DC
  Administered 2019-03-20 – 2019-03-21 (×2): 5 mg via ORAL
  Filled 2019-03-20 (×5): qty 1

## 2019-03-20 MED ORDER — ALUM & MAG HYDROXIDE-SIMETH 200-200-20 MG/5ML PO SUSP: 30.0000 mL | ORAL | Status: DC | PRN

## 2019-03-20 MED ORDER — LORAZEPAM 0.5 MG PO TABS
0.5000 mg | ORAL_TABLET | Freq: Four times a day (QID) | ORAL | Status: DC | PRN
  Administered 2019-03-20 – 2019-03-22 (×3): 0.5 mg via ORAL
  Filled 2019-03-20 (×3): qty 1
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
## 2019-03-20 NOTE — [facility] Suicide Risk Assessment (Signed)
[facility]   Nursing information obtained from:  Patient Demographic factors:  Caucasian, Access to firearms Current Mental Status:  Suicidal ideation indicated by patient, Intention to act on suicide plan, Suicidal ideation indicated by others, Self-harm thoughts, Belief that plan would result in death, Suicide plan, Self-harm behaviors Loss Factors:  Decline in physical health Historical Factors:  Prior suicide attempts, Impulsivity Risk Reduction Factors:  Sense of responsibility to family, Employed, Positive social support, Positive coping skills or problem solving skills, Responsible for children under 38 years of age, Living with another person, especially a relative, Positive therapeutic relationship  Total Time spent with patient: 45 minutes Principal Problem: MDD (major depressive disorder), recurrent severe, without psychosis (HCC) Diagnosis:  Principal Problem:   MDD (major depressive disorder), recurrent severe, without psychosis (HCC)  Subjective Data:   Continued Clinical Symptoms:  Alcohol Use Disorder Identification Test Final Score (AUDIT): 4 The "Alcohol Use Disorders Identification Test", Guidelines for Use in Primary Care, Second Edition.  World Science writerHealth Organization [facility](WHO). Score between 0-7:  no or low risk or alcohol related problems. Score between 8-15:  moderate risk of alcohol related problems. Score between 16-19:  high risk of alcohol related problems. Score 20 or above:  warrants further diagnostic evaluation for alcohol dependence and treatment.   CLINICAL FACTORS:  [id number], married , has two children ( 7,5), employed as Runner, broadcasting/film/videoteacher.  Presented to the hospital on 8/20 via [employer], who were contacted by family . Reports she was thinking of cutting self with a knife, threatening to jump out window. History of depression, and reports she has been experiencing increased depression/anxiety over recent weeks to months, partly  in the context of  significant stressors, including work related changes due to COVID, juggling work/ caring for children, with limited support. States " I guess I just felt overwhelmed and reached a breaking point". Denies psychotic symptoms. Endorses neuro-vegetative symptoms- anhedonia, erratic sleep, decreased energy level. She also endorses recent panic attacks.  She reports history of depression, anxiety, and a prior history of suicide attempts/psychiatric admissions ( at age 38, and at age 38). She has been treated with Wellbutrin in the past, but states it was not well tolerated. She does remember a trial with Lexapro/Ativan as helpful and well tolerated in the past . Has been off psychiatric medications for three years. She denies history of mania or hypomania,but does report that her brother and her father have bipolar disorder. Denies any medical illnesses, allergic to PCN, and a history of cognitive side effects from Topamax.  History of pseudotumor cerebri. Denies alcohol or drug abuse .   Dx- MDD, no psychotic features  Plan- Inpatient treatment. Medications as below.  Plan- Inpatient admission.  We reviewed medication options- agrees to Lexapro trial. Start 5 mgrs QDAY . Side effects reviewed. Ativan 0.5 mgrs Q 6 hours PRN for anxiety as needed . Check routine labs, including TSH    Musculoskeletal: Strength & Muscle Tone: within normal limits Gait & Station: normal Patient leans: N/A  Psychiatric Specialty Exam: Physical Exam  ROS  Denies headache, no chest pain, no shortness of breath at room air, no cough, no vomiting, no fever or chills   Blood pressure 133/89, pulse 85, temperature (!) 97.2 F (36.2 C), temperature source Oral, resp. rate 16, height 5\' 7"  (1.702 m), weight 117.5 kg, SpO2 100 %, currently breastfeeding.Body mass index is 40.57 kg/m.  General Appearance: Well Groomed  Eye Contact:  Good  Speech:  Normal Rate  Volume:  Normal  Mood:  Anxious and Depressed  Affect:   Congruent  Thought Process:  Linear and Descriptions of Associations: Intact  Orientation:  Other:  fully alert and attentive  Thought Content:  no hallucinations, no delusions   Suicidal Thoughts:  No denies current suicidal or self injurious ideations and currently contracts for safety on unit,   Homicidal Thoughts:  No  Memory:  recent and remote grossly intact   Judgement:  Fair  Insight:  Fair  Psychomotor Activity:  Normal  Concentration:  Concentration: Good and Attention Span: Good  Recall:  Good  Fund of Knowledge:  Good  Language:  Good  Akathisia:  Negative  Handed:  Right  AIMS (if indicated):     Assets:  Desire for Improvement Resilience  ADL's:  Intact  Cognition:  WNL  Sleep:         COGNITIVE FEATURES THAT CONTRIBUTE TO RISK:  Closed-mindedness and Loss of executive function    SUICIDE RISK:   Moderate:  Frequent suicidal ideation with limited intensity, and duration, some specificity in terms of plans, no associated intent, good self-control, limited dysphoria/symptomatology, some risk factors present, and identifiable protective factors, including available and accessible social support.  PLAN OF CARE: Patient will be admitted to inpatient psychiatric unit for stabilization and safety. Will provide and encourage milieu participation. Provide medication management and maked adjustments as needed.  Will follow daily.    I certify that inpatient services furnished can reasonably be expected to improve the patient's condition.   Jenne Campus, MD 03/20/2019, 4:46 PM
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
## 2019-03-20 NOTE — BHH Counselor (Signed)
IVC paperwork to be faxed.    Nero Sawatzky D Tayanna Talford, MS, LCMHC, CRC Triage Specialist [phone]  

## 2019-03-20 NOTE — ED Notes (Signed)
[employer] called for transportation to [facility] due to IVC.

## 2019-03-20 NOTE — BHH Group Notes (Signed)
[facility] Group Notes:  (Nursing/MHT/Case Management/Adjunct)  Date:  03/20/2019  Time:  10:00 AM  Type of Therapy:  Nurse Education  Participation Level:  Minimal  Participation Quality:  Appropriate and Attentive  Affect:  Anxious, Appropriate, Depressed and Flat  Cognitive:  Alert and Appropriate  Insight:  Appropriate  Engagement in Group:  Engaged and Supportive  Modes of Intervention:  Discussion, Education, Exploration, Rapport Building, Socialization and Support  Summary of Progress/Problems: Pt's were asked what some of the set-backs in their recovery have been. Pt's discussed alternatives and/or ways to combat these set-backs if these arise again. Examples were provided of coping skills and how to relate these to future situations. Pt shared minimally but was supportive of others and what they shared. Pt was attentive and appropriate.   Otelia Limes Collan Schoenfeld 03/20/2019, 11:36 AM

## 2019-03-20 NOTE — BHH Group Notes (Signed)
03/20/2019 8:45am Type of Group and Topic: Psychoeducational Group: Discharge Planning Participation Level: Did Not Attend  Description of Group Discharge planning group reviews patient's anticipated discharge plans and assists patients to anticipate and address any barriers to wellness/recovery in the community. Suicide prevention education is reviewed with patients in group. Therapeutic Goals 1. Patients will state their anticipated discharge plan and mental health aftercare 2. Patients will identify potential barriers to wellness in the community setting 3. Patients will engage in problem solving, solution focused discussion of ways to anticipate and address barriers to wellness/recovery   Summary of Patient Progress  Plan for Discharge/Comments: Invited, chose not to attend.   Transportation Means: Supports:   Therapeutic Modalities: Motivational Interviewing     Radonna Ricker, MSW, [place] Worker [facility]  Phone: [phone] 03/20/2019 2:19 PM
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
## 2019-03-21 LAB — URINALYSIS, COMPLETE (UACMP) WITH MICROSCOPIC
Bilirubin Urine: NEGATIVE
Glucose, UA: NEGATIVE mg/dL
Ketones, ur: 5 mg/dL — AB
Nitrite: NEGATIVE
Protein, ur: NEGATIVE mg/dL
Specific Gravity, Urine: 1.019 (ref 1.005–1.030)
pH: 6 (ref 5.0–8.0)

## 2019-03-21 LAB — LIPID PANEL
Cholesterol: 163 mg/dL (ref 0–200)
HDL: 45 mg/dL (ref >40)
LDL Cholesterol: 101 mg/dL — ABNORMAL HIGH (ref 0–99)
Total CHOL/HDL Ratio: 3.6 RATIO
Triglycerides: 86 mg/dL (ref <150)
VLDL: 17 mg/dL (ref 0–40)

## 2019-03-21 LAB — TSH: TSH: 1.311 u[IU]/mL (ref 0.350–4.500)

## 2019-03-21 LAB — HEMOGLOBIN A1C
Hgb A1c MFr Bld: 5.2 % (ref 4.8–5.6)
Mean Plasma Glucose: 102.54 mg/dL

## 2019-03-21 MED ORDER — ESCITALOPRAM OXALATE 10 MG PO TABS
10.0000 mg | ORAL_TABLET | Freq: Every day | ORAL | Status: DC
  Administered 2019-03-22 – 2019-03-23 (×2): 10 mg via ORAL
  Filled 2019-03-21 (×4): qty 1

## 2019-03-21 NOTE — [facility] Group Notes (Signed)
LCSW Group Therapy Note  Date and Time: 03/21/2019 @ 10:00am  Type of Therapy and Topic: Group Therapy: Feelings Around Returning Home & Establishing a Supportive Framework and Supporting Oneself When Supports Not Available  Participation Level: [facility] PARTICIPATION LEVEL: Active  Mood: Pleasant    Description of Group:  Patients first processed thoughts and feelings about upcoming discharge. These included fears of upcoming changes, lack of change, new living environments, judgements and expectations from others and overall stigma of mental health issues. The group then discussed the definition of a supportive framework, what that looks and feels like, and how do to discern it from an unhealthy non-supportive network. The group identified different types of supports as well as what to do when your family/friends are less than helpful or unavailable     Therapeutic Goals   1.  Patient will identify one healthy supportive network that they can use at discharge.  2.  Patient will identify one factor of a supportive framework and how to tell it from an unhealthy network.  3.  Patient able to identify one coping skill to use when they do not have positive supports from others.  4.  Patient will demonstrate ability to communicate their needs through discussion and/or role plays.     Summary of Patient Progress:     Patient was active and engaged throughout group therapy. Doctor did pull the patient out during the middle of group but she did come back to group. Patient was able to identify her healthy supports whom are her husband, 2 brothers and a close friend. Patient stated that often unhealthy supports will encourage her to deal with the issue in an unhealthy ways (drugs or being negative). Patient stated that her and her husband are going to do couples/family therapy when she is discharged.        Therapeutic Modalities  Cognitive Behavioral Therapy  Motivational  Interviewing   Ardelle Anton, MSW, [place]

## 2019-03-21 NOTE — Progress Notes (Signed)
[place] NOVEL CORONAVIRUS (COVID-19) DAILY CHECK-OFF SYMPTOMS - answer yes or no to each - every day NO YES  Have you had a fever in the past 24 hours?  . Fever (Temp > 37.80C / 100F) X   Have you had any of these symptoms in the past 24 hours? . New Cough .  Sore Throat  .  Shortness of Breath .  Difficulty Breathing .  Unexplained Body Aches   X   Have you had any one of these symptoms in the past 24 hours not related to allergies?   . Runny Nose .  Nasal Congestion .  Sneezing   X   If you have had runny nose, nasal congestion, sneezing in the past 24 hours, has it worsened?  X   EXPOSURES - check yes or no X   Have you traveled outside the state in the past 14 days?  X   Have you been in contact with someone with a confirmed diagnosis of COVID-19 or PUI in the past 14 days without wearing appropriate PPE?  X   Have you been living in the same home as a person with confirmed diagnosis of COVID-19 or a PUI (household contact)?    X   Have you been diagnosed with COVID-19?    X              What to do next: Answered NO to all: Answered YES to anything:   Proceed with unit schedule Follow the [facility] Inpatient Flowsheet.   

## 2019-03-21 NOTE — Progress Notes (Signed)
Patient has been up and active on the unit. Observed in dayroom interacting appropriately with peers. Patient currently denies having pain, -si/hi/a/v hall. Support and encouragement offered, safety maintained on unit, will continue to monitor.

## 2019-03-21 NOTE — Progress Notes (Signed)
[facility] MD Progress Note  03/21/2019 12:00 PM Dana Frost  MRN:  [id number] Subjective: Patient reports she is feeling partially better today.  She does continue to ruminate about stressors, mainly relating to stressful job, caring for children and home, particularly in the context of coronavirus related changes/restrictions.  However, states she feels less overwhelmed today and states she had a good conversation with her husband yesterday and felt that he is more understanding and supportive at this time. She currently denies suicidal ideations. Denies medication side effects.  Objective: I have reviewed chart notes and have met with patient. 38 year old female, married, employed, 2 children.  Presented for worsening depression, neurovegetative symptoms of depression, suicidal thoughts of cutting self with a knife, threatening to jump out of the window.  Describes significant psychosocial stressors.  She has a history of prior depression.  Today patient presents alert, attentive, calm, well-groomed.  Presents partially improved compared to initial presentation/admission although does remain depressed with a vaguely constricted affect.  Not tearful today.  As above, reports having had a good conversation with her husband and feeling more supported by him.  At this time denies suicidal ideations and presents future oriented. Thus far tolerating medications well, currently denies side effects. No disruptive or agitated behaviors on unit.  Polite/calm on approach. Labs-TSH 1.311, hemoglobin A1c 5.2, lipid panel unremarkable  Principal Problem: MDD (major depressive disorder), recurrent severe, without psychosis ([place]) Diagnosis: Principal Problem:   MDD (major depressive disorder), recurrent severe, without psychosis ([place])  Total Time spent with patient: 20 minutes  Past Psychiatric History:   Past Medical History:  Past Medical History:  Diagnosis Date  . Anxiety   . Asthma   . Depression   .  Gallstone   . H/O hearing loss    90%left, 10%right  . Headache(784.0)   . History of panic attacks    after death of mother  . Migraine   . Papilledema     Past Surgical History:  Procedure Laterality Date  . ANKLE RECONSTRUCTION Left    x 2  . UPPER GASTROINTESTINAL ENDOSCOPY     Family History:  Family History  Problem Relation Age of Onset  . Other Mother        Marena Chancy of history - thinks she may have had cancer  . Hypertension Father   . Hyperlipidemia Father   . Diabetes Father   . Liver disease Father    Family Psychiatric  History:  Social History:  Social History   Substance and Sexual Activity  Alcohol Use Yes  . Alcohol/week: 7.0 standard drinks  . Types: 7 Glasses of wine per week   Comment: occassionally 2x/week     Social History   Substance and Sexual Activity  Drug Use No    Social History   Socioeconomic History  . Marital status: Single    Spouse name: Not on file  . Number of children: 2  . Years of education: Masters  . Highest education level: Not on file  Occupational History  . Occupation: Pharmacist, hospital  Social Needs  . Financial resource strain: Not on file  . Food insecurity    Worry: Not on file    Inability: Not on file  . Transportation needs    Medical: Not on file    Non-medical: Not on file  Tobacco Use  . Smoking status: Former Smoker    Types: Cigarettes  . Smokeless tobacco: Never Used  Substance and Sexual Activity  . Alcohol use: Yes  Alcohol/week: 7.0 standard drinks    Types: 7 Glasses of wine per week    Comment: occassionally 2x/week  . Drug use: No  . Sexual activity: Yes    Birth control/protection: I.U.D.    Comment: last sex yesterday  Lifestyle  . Physical activity    Days per week: Not on file    Minutes per session: Not on file  . Stress: Not on file  Relationships  . Social Herbalist on phone: Not on file    Gets together: Not on file    Attends religious service: Not on file     Active member of club or organization: Not on file    Attends meetings of clubs or organizations: Not on file    Relationship status: Not on file  Other Topics Concern  . Not on file  Social History Narrative   Lives at home with husband, two children and her mother-in-law.   Right-handed.   2 cups caffeine per day, occasional soda.   Additional Social History:   Sleep: Fair  Appetite:  Fair  Current Medications: Current Facility-Administered Medications  Medication Dose Route Frequency Provider Last Rate Last Dose  . acetaminophen (TYLENOL) tablet 650 mg  650 mg Oral Q6H PRN Deloria Lair, NP   650 mg at 03/20/19 2015  . alum & mag hydroxide-simeth (MAALOX/MYLANTA) 200-200-20 MG/5ML suspension 30 mL  30 mL Oral Q4H PRN Dixon, Rashaun M, NP      . escitalopram (LEXAPRO) tablet 5 mg  5 mg Oral Daily , Myer Peer, MD   5 mg at 03/21/19 0840  . LORazepam (ATIVAN) tablet 0.5 mg  0.5 mg Oral Q6H PRN , Myer Peer, MD   0.5 mg at 03/20/19 2133  . magnesium hydroxide (MILK OF MAGNESIA) suspension 30 mL  30 mL Oral Daily PRN Deloria Lair, NP        Lab Results:  Results for orders placed or performed during the hospital encounter of 03/20/19 (from the past 48 hour(s))  Urinalysis, Complete w Microscopic     Status: Abnormal   Collection Time: 03/21/19  5:00 AM  Result Value Ref Range   Color, Urine YELLOW YELLOW   APPearance HAZY (A) CLEAR   Specific Gravity, Urine 1.019 1.005 - 1.030   pH 6.0 5.0 - 8.0   Glucose, UA NEGATIVE NEGATIVE mg/dL   Hgb urine dipstick SMALL (A) NEGATIVE   Bilirubin Urine NEGATIVE NEGATIVE   Ketones, ur 5 (A) NEGATIVE mg/dL   Protein, ur NEGATIVE NEGATIVE mg/dL   Nitrite NEGATIVE NEGATIVE   Leukocytes,Ua TRACE (A) NEGATIVE   RBC / HPF 0-5 0 - 5 RBC/hpf   WBC, UA 0-5 0 - 5 WBC/hpf   Bacteria, UA RARE (A) NONE SEEN   Squamous Epithelial / LPF 11-20 0 - 5   Mucus PRESENT     Comment: Performed at [facility], [place]  [address] [place], [place] [address]  Hemoglobin A1c     Status: None   Collection Time: 03/21/19  6:24 AM  Result Value Ref Range   Hgb A1c MFr Bld 5.2 4.8 - 5.6 %    Comment: (NOTE) Pre diabetes:          5.7%-6.4% Diabetes:              >6.4% Glycemic control for   <7.0% adults with diabetes    Mean Plasma Glucose 102.54 mg/dL    Comment: Performed at [facility], [address] [place], [place] [address]  Lipid panel     Status: Abnormal   Collection Time: 03/21/19  6:24 AM  Result Value Ref Range   Cholesterol 163 0 - 200 mg/dL   Triglycerides 86 <150 mg/dL   HDL 45 >40 mg/dL   Total CHOL/HDL Ratio 3.6 RATIO   VLDL 17 0 - 40 mg/dL   LDL Cholesterol 101 (H) 0 - 99 mg/dL    Comment:        Total Cholesterol/HDL:CHD Risk Coronary Heart Disease Risk Table                     Men   Women  1/2 Average Risk   3.4   3.3  Average Risk       5.0   4.4  2 X Average Risk   9.6   7.1  3 X Average Risk  23.4   11.0        Use the calculated Patient Ratio above and the CHD Risk Table to determine the patient's CHD Risk.        ATP III CLASSIFICATION (LDL):  <100     mg/dL   Optimal  100-129  mg/dL   Near or Above                    Optimal  130-159  mg/dL   Borderline  160-189  mg/dL   High  >190     mg/dL   Very High Performed at [facility] [address], [place], [place] [address]   TSH     Status: None   Collection Time: 03/21/19  6:24 AM  Result Value Ref Range   TSH 1.311 0.350 - 4.500 uIU/mL    Comment: Performed by a 3rd Generation assay with a functional sensitivity of <=0.01 uIU/mL. Performed at [facility], [place] [address] [place], [place] [address]     Blood Alcohol level:  Lab Results  Component Value Date   ETH <10 17/51/0258    Metabolic Disorder Labs: Lab Results  Component Value Date   HGBA1C 5.2 03/21/2019   MPG 102.54 03/21/2019   No results found for: PROLACTIN Lab Results  Component Value  Date   CHOL 163 03/21/2019   TRIG 86 03/21/2019   HDL 45 03/21/2019   CHOLHDL 3.6 03/21/2019   VLDL 17 03/21/2019   LDLCALC 101 (H) 03/21/2019    Physical Findings: AIMS: Facial and Oral Movements Muscles of Facial Expression: None, normal Lips and Perioral Area: None, normal Jaw: None, normal Tongue: None, normal,Extremity Movements Upper (arms, wrists, hands, fingers): None, normal Lower (legs, knees, ankles, toes): None, normal, Trunk Movements Neck, shoulders, hips: None, normal, Overall Severity Severity of abnormal movements (highest score from questions above): None, normal Incapacitation due to abnormal movements: None, normal Patient's awareness of abnormal movements (rate only patient's report): No Awareness, Dental Status Current problems with teeth and/or dentures?: No Does patient usually wear dentures?: No  CIWA:    COWS:     Musculoskeletal: Strength & Muscle Tone: within normal limits Gait & Station: normal Patient leans: N/A  Psychiatric Specialty Exam: Physical Exam  ROS denies headache or chest pain, no shortness of breath, no vomiting, no fever, no chills  Blood pressure (!) 131/98, pulse 95, temperature 98.3 F (36.8 C), temperature source Oral, resp. rate 16, height 5' 7"  (1.702 m), weight 117.5 kg, SpO2 100 %, currently breastfeeding.Body mass index is 40.57 kg/m.  General  Appearance: Well-groomed  Eye Contact:  Good  Speech:  Normal Rate  Volume:  Normal  Mood:  Partially improved although remains depressed  Affect:  Remains vaguely constricted but not tearful today and tends to improve partially during session  Thought Process:  Linear and Descriptions of Associations: Intact  Orientation:  Full (Time, Place, and Person)  Thought Content:  No hallucinations, no delusions  Suicidal Thoughts:  No currently denies suicidal or self-injurious ideations, contracts for safety on unit, denies homicidal or violent ideations  Homicidal Thoughts:  No   Memory:  Recent and remote grossly intact  Judgement:  Other:  Fair/improving  Insight:  Fair/improving  Psychomotor Activity:  Normal  Concentration:  Concentration: Good and Attention Span: Good  Recall:  Good  Fund of Knowledge:  Good  Language:  Good  Akathisia:  Negative  Handed:  Right  AIMS (if indicated):     Assets:  Communication Skills Desire for Improvement Resilience Vocational/Educational  ADL's:  Intact  Cognition:  WNL  Sleep:      Assessment: 38 year old female, married, employed, 2 children.  Presented for worsening depression, neurovegetative symptoms of depression, suicidal thoughts of cutting self with a knife, threatening to jump out of the window.  Describes significant psychosocial stressors.  She has a history of prior depression.  Patient is currently presenting with partially improved mood/affect although does remain depressed/vaguely constricted.  Less intensely focused on stressors today.  Denies SI.  Tolerating medications well thus far.  Treatment Plan Summary: Daily contact with patient to assess and evaluate symptoms and progress in treatment, Medication management, Plan Inpatient treatment and Medications as below Encourage group and milieu participation Increase Lexapro to 10 mg daily for depression/anxiety Continue Ativan 0.5 mg every 6 hours PRN for anxiety as needed Treatment team working on disposition planning options Jenne Campus, MD 03/21/2019, 12:00 PM

## 2019-03-21 NOTE — Progress Notes (Addendum)
D. Pt is friendly, smiles upon approach- observed in the dayroom interacting well with peers. Per pt's self inventory, pt rates her depression, hopelessness and anxiety a 3/4/5, respectively.  Pt writes that her most important goal today is "taking control of my thoughts and feelings and perspective so I can go home and continue to work on making family and life better" and writes that she will "talk through things, attend group and get rest to help her meet that goal." Pt currently denies SI/HI and AVH A. Labs and vitals monitored. Pt compliant with medications. Pt supported emotionally and encouraged to express concerns and ask questions.   R. Pt remains safe with 15 minute checks. Will continue POC.

## 2019-03-22 NOTE — Progress Notes (Signed)
[place] NOVEL CORONAVIRUS (COVID-19) DAILY CHECK-OFF SYMPTOMS - answer yes or no to each - every day NO YES  Have you had a fever in the past 24 hours?  . Fever (Temp > 37.80C / 100F) X   Have you had any of these symptoms in the past 24 hours? . New Cough .  Sore Throat  .  Shortness of Breath .  Difficulty Breathing .  Unexplained Body Aches   X   Have you had any one of these symptoms in the past 24 hours not related to allergies?   . Runny Nose .  Nasal Congestion .  Sneezing   X   If you have had runny nose, nasal congestion, sneezing in the past 24 hours, has it worsened?  X   EXPOSURES - check yes or no X   Have you traveled outside the state in the past 14 days?  X   Have you been in contact with someone with a confirmed diagnosis of COVID-19 or PUI in the past 14 days without wearing appropriate PPE?  X   Have you been living in the same home as a person with confirmed diagnosis of COVID-19 or a PUI (household contact)?    X   Have you been diagnosed with COVID-19?    X              What to do next: Answered NO to all: Answered YES to anything:   Proceed with unit schedule Follow the [facility] Inpatient Flowsheet.   

## 2019-03-22 NOTE — Progress Notes (Signed)
[facility] Group Notes:  (Nursing/MHT/Case Management/Adjunct)  Date:  03/21/2019  Time:  1330   Type of Therapy:  Nurse Education  Participation Level:  Active  Participation Quality:  Appropriate  Affect:  Appropriate  Cognitive:  Appropriate  Insight:  Appropriate  Engagement in Group:  Engaged  Modes of Intervention:  Discussion and Education  Summary of Progress/Problems:  Keatin Benham, RN, 03/21/2019  

## 2019-03-22 NOTE — Progress Notes (Signed)
D.  Pt pleasant on approach, no complaints voiced.  Pt was positive for evening wrap up group, observed engaged appropriately with peers on the unit.  Pt denies SI/HI/AVH at this time.  A.  Support and encouragement offered, medication given as ordered.  R.  Pt remains safe on the unit, will continue to monitor. 

## 2019-03-22 NOTE — [facility] Suicide Risk Assessment (Signed)
[place] INPATIENT:  Family/Significant Other Suicide Prevention Education  Suicide Prevention Education:  Education Completed; Bennette Hasty, husband, [phone]  (name of family member/significant other) has been identified by the patient as the family member/significant other with whom the patient will be residing, and identified as the person(s) who will aid the patient in the event of a mental health crisis (suicidal ideations/suicide attempt).  With written consent from the patient, the family member/significant other has been provided the following suicide prevention education, prior to the and/or following the discharge of the patient.  Husband is encouraged that patient wants to get on a treatment plan.  He had no questions, welcomed the information about suicide prevention.  He plans to buy a lock box to put the medicines in, will put the gun safe key in a locked location, and plans to also move all sharp knives into a bin that he will put away, since he does most of the cooking.   The suicide prevention education provided includes the following:  Suicide risk factors  Suicide prevention and interventions  National Suicide Hotline telephone number  [facility] assessment telephone number  [facility] Emergency Assistance [place] and/or Residential Mobile Crisis Unit telephone number  Request made of family/significant other to:  Remove weapons (e.g., guns, rifles, knives), all items previously/currently identified as safety concern.    Remove drugs/medications (over-the-counter, prescriptions, illicit drugs), all items previously/currently identified as a safety concern.  The family member/significant other verbalizes understanding of the suicide prevention education information provided.  The family member/significant other agrees to remove the items of safety concern listed above.  Berlin Hun Grossman-Orr 03/22/2019, 3:36 PM

## 2019-03-22 NOTE — [facility] Group Notes (Signed)
LCSW Group Therapy Note  03/22/2019 9:10 AM  Type of Therapy/Topic: Group Therapy: Feelings about Diagnosis  Participation Level: Active   Description of Group:  This group will allow patients to explore their thoughts and feelings about diagnoses they have received. Patients will be guided to explore their level of understanding and acceptance of these diagnoses. Facilitator will encourage patients to process their thoughts and feelings about the reactions of others to their diagnosis and will guide patients in identifying ways to discuss their diagnosis with significant others in their lives. This group will be process-oriented, with patients participating in exploration of their own experiences, giving and receiving support, and processing challenge from other group members.  Therapeutic Goals: 1. Patient will demonstrate understanding of diagnosis as evidenced by identifying two or more symptoms of the disorder 2. Patient will be able to express two feelings regarding the diagnosis 3. Patient will demonstrate their ability to communicate their needs through discussion and/or role play  Summary of Patient Progress: Dana Frost remained present throughout group and actively participated on topic. She shared that everyone experiences depression and trauma at some point in their life, but there is still stigma associated with mental illness and psychiatric medications. She shared that having a mental health diagnosis can make her feel like a scapegoat.  Therapeutic Modalities:  Cognitive Behavioral Therapy Brief Therapy Feelings Identification   Stephanie Acre, MSW, [place] Social Worker
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
## 2019-03-22 NOTE — Progress Notes (Signed)
D. Pt pleasant and friendly upon approach- visible in the milieu interacting appropriately with peers and staff. Per pt's self inventory, pt rated her depression, hopelessness and anxiety a 3/2/4, respectively.Pt currently denies SI/HI and AVH .  A. Labs and vitals monitored. Pt compliant with medications. Pt supported emotionally and encouraged to express concerns and ask questions.   R. Pt remains safe with 15 minute checks. Will continue POC.

## 2019-03-22 NOTE — Progress Notes (Signed)
[facility] MD Progress Note  03/22/2019 12:45 PM Dana Frost  MRN:  [id number] Subjective:  Patient reports some gradual improvement since admission. She states she is still anxious and that she still feels some apprehension regarding her stressors ( mainly work related and balancing work/ home/parenting responsibilies) but that she is no longer feeling as overwhelmed or hopeless . She is future oriented, and hoping to be discharged soon to reunite with her children/husband. Denies medication side effects and states she feels medications have been helpful. Denies suicidal ideations. Objective: I have reviewed chart notes and have met with patient. 38 year old female, married, employed, 2 children.  Presented for worsening depression, neurovegetative symptoms of depression, suicidal thoughts of cutting self with a knife, threatening to jump out of the window.  Describes significant psychosocial stressors.  She has a history of prior depression.  Patient is presenting with improving mood . Affect is still anxious, but to a lesser degree and is now more reactive. Denies medication side effects. Denies suicidal ideations, and currently presents future oriented . Behavior on unit calm and in good control. Pleasant on approach and interactive with peers. Reported some anxiety about BP being elevated and states her BP readings prior to admission have been within normal. BP this AM read as 148/93 .  BP rechecked by RN and writer : 125/87,  pulse 97. She denies headache, chest pain, dyspnea .   Principal Problem: MDD (major depressive disorder), recurrent severe, without psychosis ([place]) Diagnosis: Principal Problem:   MDD (major depressive disorder), recurrent severe, without psychosis ([place])  Total Time spent with patient: 20 minutes  Past Psychiatric History:   Past Medical History:  Past Medical History:  Diagnosis Date  . Anxiety   . Asthma   . Depression   . Gallstone   . H/O hearing loss     90%left, 10%right  . Headache(784.0)   . History of panic attacks    after death of mother  . Migraine   . Papilledema     Past Surgical History:  Procedure Laterality Date  . ANKLE RECONSTRUCTION Left    x 2  . UPPER GASTROINTESTINAL ENDOSCOPY     Family History:  Family History  Problem Relation Age of Onset  . Other Mother        Marena Chancy of history - thinks she may have had cancer  . Hypertension Father   . Hyperlipidemia Father   . Diabetes Father   . Liver disease Father    Family Psychiatric  History:  Social History:  Social History   Substance and Sexual Activity  Alcohol Use Yes  . Alcohol/week: 7.0 standard drinks  . Types: 7 Glasses of wine per week   Comment: occassionally 2x/week     Social History   Substance and Sexual Activity  Drug Use No    Social History   Socioeconomic History  . Marital status: Single    Spouse name: Not on file  . Number of children: 2  . Years of education: Masters  . Highest education level: Not on file  Occupational History  . Occupation: Pharmacist, hospital  Social Needs  . Financial resource strain: Not on file  . Food insecurity    Worry: Not on file    Inability: Not on file  . Transportation needs    Medical: Not on file    Non-medical: Not on file  Tobacco Use  . Smoking status: Former Smoker    Types: Cigarettes  . Smokeless tobacco: Never Used  Substance  and Sexual Activity  . Alcohol use: Yes    Alcohol/week: 7.0 standard drinks    Types: 7 Glasses of wine per week    Comment: occassionally 2x/week  . Drug use: No  . Sexual activity: Yes    Birth control/protection: I.U.D.    Comment: last sex yesterday  Lifestyle  . Physical activity    Days per week: Not on file    Minutes per session: Not on file  . Stress: Not on file  Relationships  . Social Herbalist on phone: Not on file    Gets together: Not on file    Attends religious service: Not on file    Active member of club or organization:  Not on file    Attends meetings of clubs or organizations: Not on file    Relationship status: Not on file  Other Topics Concern  . Not on file  Social History Narrative   Lives at home with husband, two children and her mother-in-law.   Right-handed.   2 cups caffeine per day, occasional soda.   Additional Social History:   Sleep: improving   Appetite:  improving  Current Medications: Current Facility-Administered Medications  Medication Dose Route Frequency Provider Last Rate Last Dose  . acetaminophen (TYLENOL) tablet 650 mg  650 mg Oral Q6H PRN Deloria Lair, NP   650 mg at 03/22/19 0934  . alum & mag hydroxide-simeth (MAALOX/MYLANTA) 200-200-20 MG/5ML suspension 30 mL  30 mL Oral Q4H PRN Dixon, Rashaun M, NP      . escitalopram (LEXAPRO) tablet 10 mg  10 mg Oral Daily Sarah Baez, Myer Peer, MD   10 mg at 03/22/19 0749  . LORazepam (ATIVAN) tablet 0.5 mg  0.5 mg Oral Q6H PRN Bryttany Tortorelli, Myer Peer, MD   0.5 mg at 03/21/19 2111  . magnesium hydroxide (MILK OF MAGNESIA) suspension 30 mL  30 mL Oral Daily PRN Deloria Lair, NP        Lab Results:  Results for orders placed or performed during the hospital encounter of 03/20/19 (from the past 48 hour(s))  Urinalysis, Complete w Microscopic     Status: Abnormal   Collection Time: 03/21/19  5:00 AM  Result Value Ref Range   Color, Urine YELLOW YELLOW   APPearance HAZY (A) CLEAR   Specific Gravity, Urine 1.019 1.005 - 1.030   pH 6.0 5.0 - 8.0   Glucose, UA NEGATIVE NEGATIVE mg/dL   Hgb urine dipstick SMALL (A) NEGATIVE   Bilirubin Urine NEGATIVE NEGATIVE   Ketones, ur 5 (A) NEGATIVE mg/dL   Protein, ur NEGATIVE NEGATIVE mg/dL   Nitrite NEGATIVE NEGATIVE   Leukocytes,Ua TRACE (A) NEGATIVE   RBC / HPF 0-5 0 - 5 RBC/hpf   WBC, UA 0-5 0 - 5 WBC/hpf   Bacteria, UA RARE (A) NONE SEEN   Squamous Epithelial / LPF 11-20 0 - 5   Mucus PRESENT     Comment: Performed at [facility], [place] [address], [place],  [place] [address]  Hemoglobin A1c     Status: None   Collection Time: 03/21/19  6:24 AM  Result Value Ref Range   Hgb A1c MFr Bld 5.2 4.8 - 5.6 %    Comment: (NOTE) Pre diabetes:          5.7%-6.4% Diabetes:              >6.4% Glycemic control for   <7.0% adults with diabetes    Mean Plasma Glucose 102.54  mg/dL    Comment: Performed at [facility], [place] [address] [place], [place] [address]  Lipid panel     Status: Abnormal   Collection Time: 03/21/19  6:24 AM  Result Value Ref Range   Cholesterol 163 0 - 200 mg/dL   Triglycerides 86 <150 mg/dL   HDL 45 >40 mg/dL   Total CHOL/HDL Ratio 3.6 RATIO   VLDL 17 0 - 40 mg/dL   LDL Cholesterol 101 (H) 0 - 99 mg/dL    Comment:        Total Cholesterol/HDL:CHD Risk Coronary Heart Disease Risk Table                     Men   Women  1/2 Average Risk   3.4   3.3  Average Risk       5.0   4.4  2 X Average Risk   9.6   7.1  3 X Average Risk  23.4   11.0        Use the calculated Patient Ratio above and the CHD Risk Table to determine the patient's CHD Risk.        ATP III CLASSIFICATION (LDL):  <100     mg/dL   Optimal  100-129  mg/dL   Near or Above                    Optimal  130-159  mg/dL   Borderline  160-189  mg/dL   High  >190     mg/dL   Very High Performed at [place] [address], [place], [place] [address]   TSH     Status: None   Collection Time: 03/21/19  6:24 AM  Result Value Ref Range   TSH 1.311 0.350 - 4.500 uIU/mL    Comment: Performed by a 3rd Generation assay with a functional sensitivity of <=0.01 uIU/mL. Performed at [facility], [place] [address] [place], [place] [address]     Blood Alcohol level:  Lab Results  Component Value Date   ETH <10 35/00/9381    Metabolic Disorder Labs: Lab Results  Component Value Date   HGBA1C 5.2 03/21/2019   MPG 102.54 03/21/2019   No results found for: PROLACTIN Lab Results  Component Value Date   CHOL 163 03/21/2019    TRIG 86 03/21/2019   HDL 45 03/21/2019   CHOLHDL 3.6 03/21/2019   VLDL 17 03/21/2019   LDLCALC 101 (H) 03/21/2019    Physical Findings: AIMS: Facial and Oral Movements Muscles of Facial Expression: None, normal Lips and Perioral Area: None, normal Jaw: None, normal Tongue: None, normal,Extremity Movements Upper (arms, wrists, hands, fingers): None, normal Lower (legs, knees, ankles, toes): None, normal, Trunk Movements Neck, shoulders, hips: None, normal, Overall Severity Severity of abnormal movements (highest score from questions above): None, normal Incapacitation due to abnormal movements: None, normal Patient's awareness of abnormal movements (rate only patient's report): No Awareness, Dental Status Current problems with teeth and/or dentures?: No Does patient usually wear dentures?: No  CIWA:    COWS:     Musculoskeletal: Strength & Muscle Tone: within normal limits Gait & Station: normal Patient leans: N/A  Psychiatric Specialty Exam: Physical Exam  ROS denies headache or chest pain, no shortness of breath, no vomiting, no fever, no chills  Blood pressure 125/87, pulse 97, temperature 98.9 F (37.2 C), resp. rate 20, height 5' 7"  (1.702 m), weight 117.5 kg, SpO2 100 %, currently  breastfeeding.Body mass index is 40.57 kg/m.  General Appearance: Well-groomed  Eye Contact:  Good  Speech:  Normal Rate  Volume:  Normal  Mood:  improving  Affect:  some residual anxiety, affect more reactive, smiles at times appropriately  Thought Process:  Linear and Descriptions of Associations: Intact  Orientation:  Full (Time, Place, and Person)  Thought Content:  No hallucinations, no delusions  Suicidal Thoughts:  No currently denies suicidal or self-injurious ideations, contracts for safety on unit, denies homicidal or violent ideations  Homicidal Thoughts:  No  Memory:  Recent and remote grossly intact  Judgement:  Other:  Fair/improving  Insight:  Fair/improving   Psychomotor Activity:  Normal  Concentration:  Concentration: Good and Attention Span: Good  Recall:  Good  Fund of Knowledge:  Good  Language:  Good  Akathisia:  Negative  Handed:  Right  AIMS (if indicated):     Assets:  Communication Skills Desire for Improvement Resilience Vocational/Educational  ADL's:  Intact  Cognition:  WNL  Sleep:  Number of Hours: 6.75   Assessment: 38 year old female, married, employed, 2 children.  Presented for worsening depression, neurovegetative symptoms of depression, suicidal thoughts of cutting self with a knife, threatening to jump out of the window.  Describes significant psychosocial stressors.  She has a history of prior depression.  Patient is presenting with partially improved mood and range of affect. No SI.  Although reports some lingering anxiety, vague sense of apprehension regarding her stressors, states she is no longer feeling overwhelmed and is now more optimistic she will be able to appropriately handle stressors. Currently tolerating Lexapro trial well .  Treatment Plan Summary: Daily contact with patient to assess and evaluate symptoms and progress in treatment, Medication management, Plan Inpatient treatment and Medications as below  Treatment Plan reviewed as below today 8/23.  Encourage group and milieu participation Increase Lexapro to 10 mg daily for depression/anxiety Continue Ativan 0.5 mg every 6 hours PRN for anxiety as needed Treatment team working on disposition planning options Jenne Campus, MD 03/22/2019, 12:45 PM    Patient ID: Dana Frost, female   DOB: 15-Nov-1980, 38 y.o.   MRN: [id number]

## 2019-03-22 NOTE — Progress Notes (Signed)
Adult Psychoeducational Group Note  Date:  03/22/2019 Time:  9:54 PM  Group Topic/Focus:  Wrap-Up Group:   The focus of this group is to help patients review their daily goal of treatment and discuss progress on daily workbooks.  Participation Level:  Active  Participation Quality:  Appropriate  Affect:  Appropriate  Cognitive:  Appropriate  Insight: Appropriate  Engagement in Group:  Engaged  Modes of Intervention:  Discussion  Additional Comments:  Pt stated his goal was to develop a discharge plan.  Pt stated she did meet her goal and is looking forward to seeing her kids.  Pt rated the day at a 8/10.  Marque Rademaker 03/22/2019, 9:54 PM

## 2019-03-22 NOTE — Progress Notes (Signed)
Adult Psychoeducational Group Note  Date:  03/22/2019 Time:  12:03 AM  Group Topic/Focus:  Wrap-Up Group:   The focus of this group is to help patients review their daily goal of treatment and discuss progress on daily workbooks.  Participation Level:  Active  Participation Quality:  Appropriate  Affect:  Appropriate  Cognitive:  Appropriate  Insight: Appropriate  Engagement in Group:  Engaged  Modes of Intervention:  Discussion  Additional Comments:  Pt stated her goal was to write out her suicide plan and pt achieved her goal.  Pt rated the day at a 7/10.  Tryone Kille 03/22/2019, 12:03 AM

## 2019-03-22 NOTE — Progress Notes (Signed)
D.  Pt pleasant on approach, denies complaints at this time.  Pt was positive for evening wrap up group, observed engaged in appropriate interaction with peers on the unit.  Pt denies SI/HI/AVH at this time.  PT requested to take night medication early tonight in hopes that she will not feel so tired tomorrow.  A.  Support and encouragement offered, medication given as ordered  R.  Pt remains safe on the unit, will continue to monitor.

## 2019-03-22 NOTE — Progress Notes (Signed)
Adult Psychoeducational Group Note  Date:  03/22/2019 Time:  10:04 PM  Group Topic/Focus:  Wrap-Up Group:   The focus of this group is to help patients review their daily goal of treatment and discuss progress on daily workbooks.  Participation Level:  Active  Participation Quality:  Appropriate  Affect:  Appropriate  Cognitive:  Appropriate  Insight: Appropriate  Engagement in Group:  Engaged  Modes of Intervention:  Discussion  Additional Comments:  Pt stated her goal was to stay positive and focused.  Pt stated she did achieve her goal and rated the day at a 6/10.  Kemara Quigley 03/22/2019, 10:04 PM

## 2019-03-23 MED ORDER — ESCITALOPRAM OXALATE 10 MG PO TABS: 10.0000 mg | ORAL_TABLET | Freq: Every day | ORAL | 0 refills | Status: DC

## 2019-03-23 NOTE — Progress Notes (Signed)
  [facility] Adult Case Management Discharge Plan :  Will you be returning to the same living situation after discharge:  Yes,  patient reports she is returning home with her spouse and children At discharge, do you have transportation home?: Yes,  patient's spouse is picking her up  Do you have the ability to pay for your medications: Yes,  [employer]  Release of information consent forms completed and in the chart;  Patient's signature needed at discharge.  Patient to Follow up at: [place] Follow up on 03/25/2019.   Why: Therapy with Elta Guadeloupe is Wednesday, 8/26 at 9:00a.  Medication management with Selinda Eon Thursday, 8/27 at 9:00a. Appts will be virtual and a link will be sent to you.  Please call office within 24 hours of discharge to get instructions to complete paperwork.  Contact information: [place] [address] [phone]           Next level of care provider has access to [place] and Suicide Prevention discussed: Yes,  patient's husband   Has patient been referred to the Quitline?: Patient refused referral  Patient has been referred for addiction treatment: N/A  Marylee Floras, LCSWA 03/23/2019, 10:32 AM

## 2019-03-23 NOTE — [facility] Suicide Risk Assessment (Signed)
[facility]   Principal Problem: MDD (major depressive disorder), recurrent severe, without psychosis (HCC) Discharge Diagnoses: Principal Problem:   MDD (major depressive disorder), recurrent severe, without psychosis (HCC)   Total Time spent with patient: 30 minutes  Musculoskeletal: Strength & Muscle Tone: within normal limits Gait & Station: normal Patient leans: N/A  Psychiatric Specialty Exam: ROS currently patient denies chest pain or shortness of breath, no cough, no vomiting, no fever, no chills  Blood pressure (!) 131/99, pulse (!) 103, temperature 98.6 F (37 C), resp. rate 20, height 5\' 7"  (1.702 m), weight 117.5 kg, SpO2 100 %, currently breastfeeding.Body mass index is 40.57 kg/m.  General Appearance: Well Groomed  Eye Contact::  Good  Speech:  Normal Rate409  Volume:  Normal  Mood:  Reports significantly improved mood, states "I feel a lot better"  Affect:  Appropriate and More reactive  Thought Process:  Linear and Descriptions of Associations: Intact  Orientation:  Full (Time, Place, and Person)  Thought Content:  No hallucinations, no delusions  Suicidal Thoughts:  No currently denies suicidal or self-injurious ideations, also denies homicidal or violent ideations  Homicidal Thoughts:  No  Memory:  Recent and remote grossly intact  Judgement:  Other:  Improving  Insight:  Improving  Psychomotor Activity:  Normal-no psychomotor agitation  Concentration:  Good  Recall:  Good  Fund of Knowledge:Good  Language: Good  Akathisia:  Negative  Handed:  Right  AIMS (if indicated):     Assets:  Communication Skills Desire for Improvement Housing Resilience Social Support Vocational/Educational  Sleep:  Number of Hours: 6.75  Cognition: WNL  ADL's:  Intact   Mental Status Per Nursing Assessment::   On Admission:  Suicidal ideation indicated by patient, Intention to act on suicide plan, Suicidal ideation indicated by others,  Self-harm thoughts, Belief that plan would result in death, Suicide plan, Self-harm behaviors  Demographic Factors:  38 year old married female, 2 children, employed  Loss Factors: Work-related stressors, particularly in the context of 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   Historical Factors: History of depression, anxiety, suicide attempt at age 38.  Was not taking any psychiatric medications prior to admission.  Risk Reduction Factors:   Responsible for children under 38 years of age, Sense of responsibility to family, Employed, Living with another person, especially a relative and Positive coping skills or problem solving skills  Continued Clinical Symptoms:  Currently patient presents alert, attentive, well-groomed, calm, describes mood as significantly improved compared to admission, affect presents full in range, no thought disorder, denies suicidal or self-injurious ideations, no homicidal or violent ideations, no hallucinations, no delusions, future oriented. She reports that she is feeling more optimistic and self assured that she will be able to handle work-related stressors effectively Behavior on unit in good control, pleasant on approach Denies medication side effects.  We have reviewed side effect profile.   Cognitive Features That Contribute To Risk:  No gross cognitive deficits noted upon discharge. Is alert , attentive, and oriented x 3   Suicide Risk:  Mild:  Suicidal ideation of limited frequency, intensity, duration, and specificity.  There are no identifiable plans, no associated intent, mild dysphoria and related symptoms, good self-control (both objective and subjective assessment), few other risk factors, and identifiable protective factors, including available and accessible social support.  Follow-up Information    [facility]25/2019.   Why: Therapy with Loraine LericheMark is Wednesday, 8/26 at 9:00a.  Medication management with Elon JesterMichele Thursday,  8/27 at 9:00a. Appts will be virtual and a link will be sent to you.  Please call office within 24 hours of discharge to get instructions to complete paperwork.  Contact information: [address] [place] [address] [phone]           Plan Of Care/Follow-up recommendations:  Activity:  As tolerated Diet:  Regular Tests:  NA Other:  See below  Patient is expressing readiness for discharge.  She is leaving unit in good spirits. No grounds for involuntary commitment at this time. Plans to return home. Follow-up as above. Patient denies history of hypertension but BP has trended high during admission.  She plans to continue to monitor her BP and will follow up with her primary care doctor for further monitoring/management if needed.  Jenne Campus, MD 03/23/2019, 10:56 AM

## 2019-03-23 NOTE — Progress Notes (Addendum)
Discharge Note:  Patient discharged home with family member.  Patient denied SI and HI.  Denied A/V hallucinations.  Suicide prevention information given and discussed with patient who stated she understood and had no questions.  Patient stated she received all her belongings, clothing, toiletries, misc items, etc.  Patient stated she  appreciated all assistance received from [facility] staff.  All required discharge information given to patient at discharge.  

## 2019-03-23 NOTE — Progress Notes (Signed)
Recreation Therapy Notes  Date:  8.24.20 Time: 0930 Location: [address] Dayroom  Group Topic: Stress Management  Goal Area(s) Addresses:  Patient will identify positive stress management techniques. Patient will identify benefits of using stress management post d/c.  Behavioral Response:  Engaged  Intervention: Stress Management  Activity :  Meditation.  LRT played a meditation that focused on making the most of your day.  Patients were to listen and follow along as meditation was played to engage in activity.   Education:  Stress Management, Discharge Planning.   Education Outcome: Acknowledges Education  Clinical Observations/Feedback: Pt attended and participated in activity.     Victorino Sparrow, LRT/CTRS         Victorino Sparrow A 03/23/2019 10:52 AM

## 2019-03-23 NOTE — Tx Team (Signed)
Interdisciplinary Treatment and Diagnostic Plan Update  03/23/2019 Time of Session: 10:45am Dana Frost MRN: [id number]  Principal Diagnosis: MDD (major depressive disorder), recurrent severe, without psychosis (HCC)  Secondary Diagnoses: Principal Problem:   MDD (major depressive disorder), recurrent severe, without psychosis (HCC)   Current Medications:  Current Facility-Administered Medications  Medication Dose Route Frequency Provider Last Rate Last Dose  . acetaminophen (TYLENOL) tablet 650 mg  650 mg Oral Q6H PRN Jearld Leschixon, Rashaun M, NP   650 mg at 03/22/19 1846  . alum & mag hydroxide-simeth (MAALOX/MYLANTA) 200-200-20 MG/5ML suspension 30 mL  30 mL Oral Q4H PRN Dixon, Rashaun M, NP      . escitalopram (LEXAPRO) tablet 10 mg  10 mg Oral Daily Cobos, Rockey SituFernando A, MD   10 mg at 03/23/19 0839  . LORazepam (ATIVAN) tablet 0.5 mg  0.5 mg Oral Q6H PRN Cobos, Rockey SituFernando A, MD   0.5 mg at 03/22/19 2031  . magnesium hydroxide (MILK OF MAGNESIA) suspension 30 mL  30 mL Oral Daily PRN Jearld Leschixon, Rashaun M, NP       PTA Medications: Medications Prior to Admission  Medication Sig Dispense Refill Last Dose  . Multiple Vitamins-Minerals (MULTI ADULT GUMMIES PO) Take 1 each by mouth daily.     . ondansetron (ZOFRAN ODT) 4 MG disintegrating tablet Take 1 tablet (4 mg total) by mouth every 8 (eight) hours as needed. (Patient not taking: Reported on 03/19/2019) 20 tablet 11   . rizatriptan (MAXALT-MLT) 10 MG disintegrating tablet Take 1 tablet (10 mg total) by mouth as needed for migraine. May repeat in 2 hours if needed (Patient not taking: Reported on 03/19/2019) 12 tablet 11   . topiramate (TOPAMAX) 100 MG tablet Take 1 tablet (100 mg total) by mouth 2 (two) times daily. (Patient not taking: Reported on 03/19/2019) 60 tablet 11     Patient Stressors: Health problems Occupational concerns Traumatic event  Patient Strengths: Ability for insight Average or above average intelligence Capable of independent  living MetallurgistCommunication skills Financial means General fund of knowledge Physical Health Supportive family/friends Work skills  Treatment Modalities: Medication Management, Group therapy, Case management,  1 to 1 session with clinician, Psychoeducation, Recreational therapy.   Physician Treatment Plan for Primary Diagnosis: MDD (major depressive disorder), recurrent severe, without psychosis (HCC) Long Term Goal(s): Improvement in symptoms so as ready for discharge Improvement in symptoms so as ready for discharge   Short Term Goals: Ability to identify changes in lifestyle to reduce recurrence of condition will improve Ability to verbalize feelings will improve Ability to disclose and discuss suicidal ideas Ability to identify and develop effective coping behaviors will improve Compliance with prescribed medications will improve Ability to identify triggers associated with substance abuse/mental health issues will improve  Medication Management: Evaluate patient's response, side effects, and tolerance of medication regimen.  Therapeutic Interventions: 1 to 1 sessions, Unit Group sessions and Medication administration.  Evaluation of Outcomes: Adequate for Discharge  Physician Treatment Plan for Secondary Diagnosis: Principal Problem:   MDD (major depressive disorder), recurrent severe, without psychosis (HCC)  Long Term Goal(s): Improvement in symptoms so as ready for discharge Improvement in symptoms so as ready for discharge   Short Term Goals: Ability to identify changes in lifestyle to reduce recurrence of condition will improve Ability to verbalize feelings will improve Ability to disclose and discuss suicidal ideas Ability to identify and develop effective coping behaviors will improve Compliance with prescribed medications will improve Ability to identify triggers associated with substance abuse/mental health issues will  improve     Medication Management: Evaluate  patient's response, side effects, and tolerance of medication regimen.  Therapeutic Interventions: 1 to 1 sessions, Unit Group sessions and Medication administration.  Evaluation of Outcomes: Adequate for Discharge   RN Treatment Plan for Primary Diagnosis: MDD (major depressive disorder), recurrent severe, without psychosis ([place]) Long Term Goal(s): Knowledge of disease and therapeutic regimen to maintain health will improve  Short Term Goals: Ability to participate in decision making will improve, Ability to verbalize feelings will improve, Ability to disclose and discuss suicidal ideas, Ability to identify and develop effective coping behaviors will improve and Compliance with prescribed medications will improve  Medication Management: RN will administer medications as ordered by provider, will assess and evaluate patient's response and provide education to patient for prescribed medication. RN will report any adverse and/or side effects to prescribing provider.  Therapeutic Interventions: 1 on 1 counseling sessions, Psychoeducation, Medication administration, Evaluate responses to treatment, Monitor vital signs and CBGs as ordered, Perform/monitor CIWA, COWS, AIMS and Fall Risk screenings as ordered, Perform wound care treatments as ordered.  Evaluation of Outcomes: Adequate for Discharge   LCSW Treatment Plan for Primary Diagnosis: MDD (major depressive disorder), recurrent severe, without psychosis ([place]) Long Term Goal(s): Safe transition to appropriate next level of care at discharge, Engage patient in therapeutic group addressing interpersonal concerns.  Short Term Goals: Engage patient in aftercare planning with referrals and resources and Increase skills for wellness and recovery  Therapeutic Interventions: Assess for all discharge needs, 1 to 1 time with Social worker, Explore available resources and support systems, Assess for adequacy in community support network, Educate family  and significant other(s) on suicide prevention, Complete Psychosocial Assessment, Interpersonal group therapy.  Evaluation of Outcomes: Adequate for Discharge   Progress in Treatment: Attending groups: Yes. Participating in groups: Yes. Taking medication as prescribed: Yes. Toleration medication: Yes. Family/Significant other contact made: Yes, individual(s) contacted:  pt's husband Patient understands diagnosis: Yes. Discussing patient identified problems/goals with staff: Yes. Medical problems stabilized or resolved: Yes. Denies suicidal/homicidal ideation: Yes. Issues/concerns per patient self-inventory: No. Other:   New problem(s) identified: No, Describe:  None  New Short Term/Long Term Goal(s): Medication stabilization, elimination of SI thoughts, and development of a comprehensive mental wellness plan.   Patient Goals:  "Make sure I am set with my meds and resources for aftercare. I want a psychiatrist and therapy (family and individual)"  Discharge Plan or Barriers: CSW will continue to follow up for appropriate referrals and possible discharge planning  Reason for Continuation of Hospitalization: Patient is discharging today.   Estimated Length of Stay: Patient is discharging today.   Attendees: Patient: Dana Frost  03/23/2019  Physician:  03/23/2019  Nurse Practitioner: Marcie Bal, NP 03/23/2019   Nursing: Legrand Como, RN 03/23/2019   RN Care Manager: 03/23/2019  Social Worker: Ardelle Anton, [place] 03/23/2019   Recreational Therapist:  03/23/2019  Other:  03/23/2019   Other:  03/23/2019   Other: 03/23/2019      Scribe for Treatment Team: Trecia Rogers, LCSW 03/23/2019 12:50 PM

## 2019-03-23 NOTE — Progress Notes (Signed)
D:  Patient's self inventory sheet, patient sleeps good, no sleep medication.  Fair appetite, normal energy level, good concentration.  Rated depression and hopeless 1, anxiety 3.  Denied withdrawals.  Denied SI.  Denied physical problems.  Physical pain, hip, feet, prior injury, worst pain #3 in past 24 hours.  Plans to follow up with MD and employer, spend time with family.  Does have discharge plans. A:  Medications administered per MD orders.  Emotional support and encouragement given patient. R:  Denied SI and HI, contracts for safety.  Denied A/V hallucinations.  Safety maintained with 15 minute checks.

## 2019-03-23 NOTE — Progress Notes (Signed)
Spiritual care group on grief and loss facilitated by chaplain Jerene Pitch  Group Goal:  Support / Education around grief and loss Members engage in facilitated group support and psycho-social education.  Group Description:  Following introductions and group rules, group members engaged in facilitated group dialog and support around topic of loss, with particular support around experiences of loss in their lives. Group Identified types of loss (relationships / self / things) and identified patterns, circumstances, and changes that precipitate losses. Reflected on thoughts / feelings around loss, normalized grief responses, and recognized variety in grief experience. Patient Progress:  Dana Frost was present throughout group.  Appropriate affect, engaged with facilitator and group members.  Stated she had been caring for others since her mother and father's passing when she was young.  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  She feels that when she acknowledges grief, it is charged and "erupts like a volcano"  Wishes to work with a grief counselor to have safe space to process grief from her mother's passing and integrate these emotions.  She also spoke with group about fears and limitations in making new close relationships due to grief around the way friendships have ended in the past.  She identified making small steps to overcome these by reaching out to people.
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
## 2019-06-11 ENCOUNTER — Ambulatory Visit: Payer: Self-pay

## 2019-06-11 ENCOUNTER — Encounter: Payer: Self-pay | Admitting: Orthopedic Surgery

## 2019-06-11 ENCOUNTER — Other Ambulatory Visit: Payer: Self-pay

## 2019-06-11 ENCOUNTER — Ambulatory Visit (INDEPENDENT_AMBULATORY_CARE_PROVIDER_SITE_OTHER): Payer: BC Managed Care – PPO | Admitting: Orthopedic Surgery

## 2019-06-11 VITALS — Ht 67.0 in | Wt 283.0 lb

## 2019-06-11 DIAGNOSIS — M25562 Pain in left knee: Secondary | ICD-10-CM

## 2019-06-11 DIAGNOSIS — M79672 Pain in left foot: Secondary | ICD-10-CM

## 2019-06-11 DIAGNOSIS — M79671 Pain in right foot: Secondary | ICD-10-CM

## 2019-06-11 DIAGNOSIS — G8929 Other chronic pain: Secondary | ICD-10-CM | POA: Diagnosis not present

## 2019-06-12 ENCOUNTER — Encounter: Payer: Self-pay | Admitting: Orthopedic Surgery

## 2019-06-12 NOTE — Progress Notes (Signed)
Office Visit Note   Patient: Dana Frost           Date of Birth: Nov 30, 1980           MRN: [id number] Visit Date: 06/11/2019 Requested by: Elisabeth Cara, PA-C [id number] [place],   [address] PCP: Belva Bertin, [place], [place]  Subjective: Chief Complaint  Patient presents with  . Foot Pain  . Leg Pain    HPI: Dana Frost is a 38 y.o. female who presents to the office complaining of left knee pain and bilateral foot pain.  Patient has a long history of knee pain since 2007.  Patient notes that she has daily pain with her left knee.  She denies any mechanical symptoms of the knee.  Pain is worse with squats and lunges and is causing her to not be able to exercise.  She was previously running for weight loss now she is forced to just walk and do yoga as she can tolerate.  She has one incidence of patellar dislocation when she was 38 years old.  She denies any radicular leg pain, numbness/tingling, swelling of the left knee.  She has been tested for rheumatoid arthritis in the past and states she has been negative.  She takes meloxicam that she receives from her primary care and states that this provides great relief.  Patient also notes bilateral foot pain.  Her left foot is significantly more painful than her right foot.  She has had multiple foot fractures throughout bilateral feet.  She notes the majority of her pain is over the dorsal lateral left foot.  She notes daily pain that is worse with ambulation.  She has had 2 ankle surgeries on the left ankle in the past.  Denies any pain radiation..                ROS:  All systems reviewed are negative as they relate to the chief complaint within the history of present illness.  Patient denies fevers or chills.  Assessment & Plan: Visit Diagnoses:  1. Chronic pain of left knee   2. Bilateral foot pain   3. Lateral joint line tenderness of knee, left   4. Medial joint line tenderness of knee, left     Plan:  Patient is a 38 year old female who presents complaining of left knee pain and bilateral foot pain.  She is not having any mechanical symptoms of the left knee.  On exam she has diffuse tenderness throughout the left knee with a trace effusion.  X-rays of the left knee from primary care office were reviewed and negative for any fracture, dislocation, significant degenerative changes.  X-rays taken today of bilateral feet were reviewed and negative for any significant findings aside from hypertrophic union of a a well-healed fifth metatarsal shaft fracture.  This correlates with the area of most tenderness that patient has on exam.  There is not much to do about this surgically and I think that patient would do best with controlling her pain with anti-inflammatories as she has been doing.  As for the left knee pain, recommended the patient try left knee injection.  Patient received left knee injection and tolerated the procedure well.  She will follow-up in 6 weeks and we will consider MRI if knee pain persists or returns.  Patient agreed with the plan and will follow up with the office in 6 weeks.  Follow-Up Instructions: No follow-ups on file.   Orders:  Orders Placed This  Encounter  Procedures  . XR Foot Complete Right  . XR Foot Complete Left   No orders of the defined types were placed in this encounter.     Procedures: Large Joint Inj: L knee on 06/14/2019 5:47 PM Indications: diagnostic evaluation, joint swelling and pain Details: 18 G 1.5 in needle, superolateral approach  Arthrogram: No  Medications: 5 mL lidocaine 1 %; 40 mg methylPREDNISolone acetate 40 MG/ML; 4 mL bupivacaine 0.25 % Outcome: tolerated well, no immediate complications Procedure, treatment alternatives, risks and benefits explained, specific risks discussed. Consent was given by the patient. Immediately prior to procedure a time out was called to verify the correct patient, procedure, equipment, support staff and  site/side marked as required. Patient was prepped and draped in the usual sterile fashion.       Clinical Data: No additional findings.  Objective: Vital Signs: Ht 5\' 7"  (1.702 m)   Wt 283 lb (128.4 kg)   BMI 44.32 kg/m   Physical Exam:  Constitutional: Patient appears well-developed HEENT:  Head: Normocephalic Eyes:EOM are normal Neck: Normal range of motion Cardiovascular: Normal rate Pulmonary/chest: Effort normal Neurologic: Patient is alert Skin: Skin is warm Psychiatric: Patient has normal mood and affect  Ortho Exam:  Left knee Exam Trace effusion Extensor mechanism intact No TTP over the quad tendon, patella, tibial tubercle, LCL/MCL insertions Moderate TTP over the medial and lateral joint lines.  Mild TTP over the patellar tendon and pes anserine bursa Stable to varus/valgus stresses.  Stable to anterior/posterior drawer Extension to 0 degrees Flexion > 90 degrees Negative straight leg raise bilaterally No pain elicited with internal rotation/external rotation of left hip joint  Bilateral foot exam Dorsiflexion, plantarflexion, eversion, inversion intact. Moderate TTP over the dorsal lateral foot in the area of the fifth metatarsal on the left foot No significant pain with resisted eversion, inversion No tenderness over the plantar fascia, Achilles tendon insertion, retrocalcaneal space, fifth metatarsal base, forefoot, medial/lateral malleoli No pain with stressing of the Lisfranc ligaments bilaterally  Specialty Comments:  No specialty comments available.  Imaging: No results found.   PMFS History: Patient Active Problem List   Diagnosis Date Noted  . MDD (major depressive disorder), recurrent severe, without psychosis ([facility]) 03/20/2019  . Chronic migraine 04/30/2017  . Pseudotumor cerebri 04/30/2017  . Labor and delivery, indication for care 04/18/2014  . Postpartum state 04/18/2014  . H/O hearing loss    Past Medical History:  Diagnosis  Date  . Anxiety   . Asthma   . Depression   . Gallstone   . H/O hearing loss    90%left, 10%right  . Headache(784.0)   . History of panic attacks    after death of mother  . Migraine   . Papilledema     Family History  Problem Relation Age of Onset  . Other Mother        04/20/2014 of history - thinks she may have had cancer  . Hypertension Father   . Hyperlipidemia Father   . Diabetes Father   . Liver disease Father     Past Surgical History:  Procedure Laterality Date  . ANKLE RECONSTRUCTION Left    x 2  . UPPER GASTROINTESTINAL ENDOSCOPY     Social History   Occupational History  . Occupation: [employer]  Tobacco Use  . Smoking status: Former Smoker    Types: Cigarettes  . Smokeless tobacco: Never Used  Substance and Sexual Activity  . Alcohol use: Yes    Alcohol/week: 7.0 standard  drinks    Types: 7 Glasses of wine per week    Comment: occassionally 2x/week  . Drug use: No  . Sexual activity: Yes    Birth control/protection: I.U.D.    Comment: last sex yesterday

## 2019-06-14 ENCOUNTER — Encounter: Payer: Self-pay | Admitting: Orthopedic Surgery

## 2019-06-14 DIAGNOSIS — M25562 Pain in left knee: Secondary | ICD-10-CM

## 2019-06-14 MED ORDER — BUPIVACAINE HCL 0.25 % IJ SOLN
4.0000 mL | INTRAMUSCULAR | Status: AC | PRN
  Administered 2019-06-14: 4 mL via INTRA_ARTICULAR

## 2019-06-14 MED ORDER — METHYLPREDNISOLONE ACETATE 40 MG/ML IJ SUSP
40.0000 mg | INTRAMUSCULAR | Status: AC | PRN
  Administered 2019-06-14: 18:00:00 40 mg via INTRA_ARTICULAR

## 2019-06-14 MED ORDER — LIDOCAINE HCL 1 % IJ SOLN
5.0000 mL | INTRAMUSCULAR | Status: AC | PRN
  Administered 2019-06-14: 5 mL

## 2019-08-25 ENCOUNTER — Other Ambulatory Visit: Payer: Self-pay | Admitting: Obstetrics and Gynecology

## 2019-09-11 ENCOUNTER — Ambulatory Visit (HOSPITAL_BASED_OUTPATIENT_CLINIC_OR_DEPARTMENT_OTHER)
Admission: RE | Admit: 2019-09-11 | Payer: BC Managed Care – PPO | Source: Home / Self Care | Admitting: Obstetrics and Gynecology

## 2019-09-11 ENCOUNTER — Encounter (HOSPITAL_BASED_OUTPATIENT_CLINIC_OR_DEPARTMENT_OTHER): Admission: RE | Payer: Self-pay | Source: Home / Self Care

## 2019-09-11 SURGERY — SALPINGECTOMY, BILATERAL, LAPAROSCOPIC
Anesthesia: Choice

## 2019-09-24 ENCOUNTER — Other Ambulatory Visit: Payer: Self-pay

## 2019-09-24 ENCOUNTER — Ambulatory Visit: Payer: BC Managed Care – PPO | Attending: Internal Medicine

## 2019-09-24 DIAGNOSIS — Z23 Encounter for immunization: Secondary | ICD-10-CM | POA: Insufficient documentation

## 2019-09-24 NOTE — Progress Notes (Signed)
   Covid-19 Vaccination Clinic  Name:  Dana Frost    MRN: [id number] DOB: 10-01-1980  09/24/2019  [name] was observed post Covid-19 immunization for 15 minutes without incidence. She was provided with Vaccine Information Sheet and instruction to access the V-Safe system.   [name] was instructed to call 911 with any severe reactions post vaccine: Marland Kitchen Difficulty breathing  . Swelling of your face and throat  . A fast heartbeat  . A bad rash all over your body  . Dizziness and weakness    Immunizations Administered    Name Date Dose VIS Date Route   Moderna COVID-19 Vaccine 09/24/2019 10:46 AM 0.5 mL 06/30/2019 Intramuscular   Manufacturer: Moderna   Lot: [id number]   NDC: 41638-453-64

## 2019-10-20 ENCOUNTER — Ambulatory Visit: Payer: BC Managed Care – PPO | Attending: Family

## 2019-10-20 DIAGNOSIS — Z23 Encounter for immunization: Secondary | ICD-10-CM

## 2019-10-20 NOTE — Progress Notes (Signed)
   Covid-19 Vaccination Clinic  Name:  Char Feltman    MRN: [id number] DOB: February 23, 1981  10/20/2019  Ms. Arrey was observed post Covid-19 immunization for 30 minutes based on pre-vaccination screening without incident. She was provided with Vaccine Information Sheet and instruction to access the V-Safe system.   Ms. Huckeby was instructed to call 911 with any severe reactions post vaccine: Marland Kitchen Difficulty breathing  . Swelling of face and throat  . A fast heartbeat  . A bad rash all over body  . Dizziness and weakness   Immunizations Administered    Name Date Dose VIS Date Route   Moderna COVID-19 Vaccine 10/20/2019 10:28 AM 0.5 mL 06/30/2019 Intramuscular   Manufacturer: Moderna   Lot: [id number]   NDC: 61518-343-73

## 2021-05-29 ENCOUNTER — Other Ambulatory Visit (HOSPITAL_COMMUNITY): Payer: Self-pay | Admitting: General Surgery

## 2021-05-29 ENCOUNTER — Other Ambulatory Visit: Payer: Self-pay | Admitting: General Surgery

## 2021-06-06 ENCOUNTER — Ambulatory Visit (HOSPITAL_COMMUNITY)
Admission: RE | Admit: 2021-06-06 | Discharge: 2021-06-06 | Disposition: A | Discharge status: Home / Self Care | Payer: BC Managed Care – PPO | Source: Ambulatory Visit | Attending: General Surgery | Admitting: General Surgery

## 2021-06-09 ENCOUNTER — Other Ambulatory Visit: Payer: Self-pay

## 2021-06-09 ENCOUNTER — Encounter (HOSPITAL_COMMUNITY)
Admission: RE | Admit: 2021-06-09 | Discharge: 2021-06-09 | Disposition: A | Discharge status: Home / Self Care | Payer: BC Managed Care – PPO | Source: Ambulatory Visit | Attending: General Surgery | Admitting: General Surgery

## 2021-06-09 DIAGNOSIS — Z01810 Encounter for preprocedural cardiovascular examination: Secondary | ICD-10-CM | POA: Diagnosis not present

## 2021-07-31 ENCOUNTER — Other Ambulatory Visit: Payer: Self-pay

## 2021-07-31 ENCOUNTER — Encounter: Payer: BC Managed Care – PPO | Attending: General Surgery | Admitting: Skilled Nursing Facility1

## 2021-07-31 ENCOUNTER — Encounter: Payer: Self-pay | Admitting: Skilled Nursing Facility1

## 2021-07-31 DIAGNOSIS — G8929 Other chronic pain: Secondary | ICD-10-CM | POA: Insufficient documentation

## 2021-07-31 DIAGNOSIS — M79672 Pain in left foot: Secondary | ICD-10-CM | POA: Insufficient documentation

## 2021-07-31 DIAGNOSIS — E669 Obesity, unspecified: Secondary | ICD-10-CM

## 2021-07-31 DIAGNOSIS — M79671 Pain in right foot: Secondary | ICD-10-CM | POA: Diagnosis not present

## 2021-07-31 DIAGNOSIS — Z6841 Body Mass Index (BMI) 40.0 and over, adult: Secondary | ICD-10-CM | POA: Insufficient documentation

## 2021-07-31 DIAGNOSIS — F319 Bipolar disorder, unspecified: Secondary | ICD-10-CM | POA: Diagnosis not present

## 2021-07-31 NOTE — Progress Notes (Signed)
Nutrition Assessment for Bariatric Surgery Medical Nutrition Therapy Appt Start Time: 10:00    End Time: 11:00  Patient was seen on 07/31/2021 for Pre-Operative Nutrition Assessment. Letter of approval faxed to [facility] Surgery bariatric surgery program coordinator on 07/31/2021.   Referral stated Supervised Weight Loss (SWL) visits needed: 0  Pt completed visits.   Pt has cleared nutrition requirements.   Planned surgery: sleeve gastrectomy  Pt expectation of surgery: to lose weight  Pt expectation of dietitian: none stated    NUTRITION ASSESSMENT   Anthropometrics  Start weight at [facility]: 269.6 lbs (date: 07/31/2021)  Height: 66.5 in BMI: 42.86 kg/m2     Clinical  Medical hx: bipolar depression, anxiety Medications: see list  Labs: vitamin D 23.9 Notable signs/symptoms: back pain Any previous deficiencies? Yes: vitamin D  Micronutrient Nutrition Focused Physical Exam: Hair: No issues observed Eyes: No issues observed Mouth: No issues observed Neck: No issues observed Nails: No issues observed Skin: No issues observed  Lifestyle & Dietary Hx  Pt states she knows the surgeon projected 80 pound weight loss for stating she hope she loses more.   Pt states she has done elimination diets to see how she does with certain foods namely nuts and dairy.  Pt states when she got her scan it did show reflux.  Pt states she walks about 30 minutes 3 days a week.   24-Hr Dietary Recall First Meal: skipped or eggs + avocado + salsa sometimes  Snack:  Second Meal: skipped or factor meal or lentils Snack:  Third Meal: chicken or fish or a meal from a company Snack:  Beverages: water, juice   Estimated Energy Needs Calories: 1500   NUTRITION DIAGNOSIS  Overweight/obesity ([place]-3.3) related to past poor dietary habits and physical inactivity as evidenced by patient w/ planned sleeve gastrectomy surgery following dietary guidelines for continued weight loss.    NUTRITION  INTERVENTION  Nutrition counseling (C-1) and education (E-2) to facilitate bariatric surgery goals.  Educated pt on micronutrient deficiencies post surgery and strategies to mitigate that risk   Pre-Op Goals Reviewed with the Patient Track food and beverage intake (pen and paper, MyFitness Pal, Baritastic app, etc.) Make healthy food choices while monitoring portion sizes Consume 3 meals per day or try to eat every 3-5 hours Avoid concentrated sugars and fried foods Keep sugar & fat in the single digits per serving on food labels Practice CHEWING your food (aim for applesauce consistency) Practice not drinking 15 minutes before, during, and 30 minutes after each meal and snack Avoid all carbonated beverages (ex: soda, sparkling beverages)  Limit caffeinated beverages (ex: coffee, tea, energy drinks) Avoid all sugar-sweetened beverages (ex: regular soda, sports drinks)  Avoid alcohol  Aim for 64-100 ounces of FLUID daily (with at least half of fluid intake being plain water)  Aim for at least 60-80 grams of PROTEIN daily Look for a liquid protein source that contains ?15 g protein and ?5 g carbohydrate (ex: shakes, drinks, shots) Make a list of non-food related activities Physical activity is an important part of a healthy lifestyle so keep it moving! The goal is to reach 150 minutes of exercise per week, including cardiovascular and weight baring activity.  *Goals that are bolded indicate the pt would like to start working towards these  Handouts Provided Include  Bariatric Surgery handouts (Nutrition Visits, Pre-Op Goals, Protein Shakes, Vitamins & Minerals)  Learning Style & Readiness for Change Teaching method utilized: Visual & Auditory  Demonstrated degree of understanding via:  Teach Back  Readiness Level: Action  Barriers to learning/adherence to lifestyle change: none identified      MONITORING & EVALUATION Dietary intake, weekly physical activity, body weight, and pre-op  goals reached at next nutrition visit.    Next Steps  Patient is to follow up at [facility] for Pre-Op Class >2 weeks before surgery for further nutrition education.  Pt has completed visits. No further supervised visits required/recomended

## 2021-08-28 ENCOUNTER — Other Ambulatory Visit: Payer: Self-pay

## 2021-08-28 ENCOUNTER — Encounter: Payer: BC Managed Care – PPO | Admitting: Skilled Nursing Facility1

## 2021-08-28 DIAGNOSIS — E669 Obesity, unspecified: Secondary | ICD-10-CM

## 2021-08-28 NOTE — Progress Notes (Signed)
Pre-Operative Nutrition Class:    Patient was seen on 08/28/2021 for Pre-Operative Bariatric Surgery Education at [facility].    Surgery date:  Surgery type: sleeve Start weight at [facility]: 269.6 Weight today: 262  Samples given per MNT protocol. Patient educated on appropriate usage:  Ensure max exp: September 27, 2021 Ensure max lot: [id number]  Chewable bariatric advantage: advanced multi EA exp: 08/23 Chewable bariatric advantage: advanced multi EA lot: [id number]  Bariatric advantage calcium citrate exp: 02/23 Bariatric advantage calcium citrate lot: [id number]   The following the learning objectives were met by the patient during this course: Identify Pre-Op Dietary Goals and will begin 2 weeks pre-operatively Identify appropriate sources of fluids and proteins  State protein recommendations and appropriate sources pre and post-operatively Identify Post-Operative Dietary Goals and will follow for 2 weeks post-operatively Identify appropriate multivitamin and calcium sources Describe the need for physical activity post-operatively and will follow MD recommendations State when to call healthcare provider regarding medication questions or post-operative complications When having a diagnosis of diabetes understanding hypoglycemia symptoms and the inclusion of 1 complex carbohydrate per meal  Handouts given during class include: Pre-Op Bariatric Surgery Diet Handout Protein Shake Handout Post-Op Bariatric Surgery Nutrition Handout BELT Program Information Flyer Support Group Information Flyer [facility] Bariatric Supplements Price List  Follow-Up Plan: Patient will follow-up at [facility] 2 weeks post operatively for diet advancement per MD.

## 2021-09-04 ENCOUNTER — Telehealth: Payer: Self-pay | Admitting: Skilled Nursing Facility1

## 2021-09-04 NOTE — Telephone Encounter (Signed)
Hi Janalee   This looks fine for one serving a day but limit it to the one scoop due to its caloric content. Great idea to jazz up that liquid phase!  From: [email or web address]>  Sent: Saturday, September 02, 2021 10:45 AM To: Serena Colonel [email or web address]> Subject: Nutrition Question  *Caution - External email - see footer for warnings* Hi Ms. Willow Reczek,  I have a question for you about a supplement powder you add to water. My kids tasted it at [facility] and wanted and it looked much better than regular juice or mix. I drink water but I figured I might want something to mix it up a bit during the liquid phase and I don't like Crystal Light. Would you mind taking a look to see if this is something you would say falls within the guidelines?

## 2021-09-07 ENCOUNTER — Ambulatory Visit: Payer: Self-pay | Admitting: General Surgery

## 2021-09-07 DIAGNOSIS — Z01818 Encounter for other preprocedural examination: Secondary | ICD-10-CM

## 2021-09-13 ENCOUNTER — Other Ambulatory Visit (HOSPITAL_COMMUNITY): Payer: Self-pay

## 2021-09-14 NOTE — Progress Notes (Addendum)
Anesthesia Review:  PCP: Dana Frost- LOV 08/18/21  Cardiologist : DR Allena Katz ( Care Everywhere0 LVO 8/6/202 for palpitations  Chest x-ray : 06/07/21  EKG :06/09/21  Echo : Stress test: Cardiac Cath :  Activity level:  can do a flight of stairs without difficulty  Sleep Study/ CPAP : none  Fasting Blood Sugar :      / Checks Blood Sugar -- times a day:   Blood Thinner/ Instructions /Last Dose: ASA / Instructions/ Last Dose :   Covid test on 3/3 at 0800am

## 2021-09-14 NOTE — Progress Notes (Signed)
Covid test on 09/29/21  Come thru main [facility] at [facility] have a seat in the lobby on the right as you come thru the door.  Call [phone] and let them know you ar ehere for covid testing.                  Your procedure is scheduled on:  10/03/21   Report to [facility] Main  Entrance   Report to admitting at   [phone]     Call this number if you have problems the morning of surgery [phone]    REMEMBER: NO  SOLID FOOD CANDY OR GUM AFTER MIDNIGHT. CLEAR LIQUIDS UNTIL   0830am         . NOTHING BY MOUTH EXCEPT CLEAR LIQUIDS UNTIL  0830am   . PLEASE FINISH ENSURE DRINK PER SURGEON ORDER  WHICH NEEDS TO BE COMPLETED AT     0830am  .      CLEAR LIQUID DIET   Foods Allowed                                                                    Coffee and tea, regular and decaf                            Fruit ices (not with fruit pulp)                                      Iced Popsicles                                    Carbonated beverages, regular and diet                                    Cranberry, grape and apple juices Sports drinks like Gatorade Lightly seasoned clear broth or consume(fat free) Sugar, honey syrup ___________________________________________________________________      BRUSH YOUR TEETH MORNING OF SURGERY AND RINSE YOUR MOUTH OUT, NO CHEWING GUM CANDY OR MINTS.     Take these medicines the morning of surgery with A SIP OF WATER:  lamictal   DO NOT TAKE ANY DIABETIC MEDICATIONS DAY OF YOUR SURGERY                               You may not have any metal on your body including hair pins and              piercings  Do not wear jewelry, make-up, lotions, powders or perfumes, deodorant             Do not wear nail polish on your fingernails.  Do not shave  48 hours prior to surgery.              Men may shave face and neck.   Do not bring valuables to the hospital. [facility] IS NOT  RESPONSIBLE   FOR VALUABLES.  Contacts, dentures  or bridgework may not be worn into surgery.  Leave suitcase in the car. After surgery it may be brought to your room.     Patients discharged the day of surgery will not be allowed to drive home. IF YOU ARE HAVING SURGERY AND GOING HOME THE SAME DAY, YOU MUST HAVE AN ADULT TO DRIVE YOU HOME AND BE WITH YOU FOR 24 HOURS. YOU MAY GO HOME BY TAXI OR UBER OR ORTHERWISE, BUT AN ADULT MUST ACCOMPANY YOU HOME AND STAY WITH YOU FOR 24 HOURS.  Name and phone number of your driver:  Special Instructions: N/A              Please read over the following fact sheets you were given: _____________________________________________________________________  [facility] - Preparing for Surgery Before surgery, you can play an important role.  Because skin is not sterile, your skin needs to be as free of germs as possible.  You can reduce the number of germs on your skin by washing with CHG (chlorahexidine gluconate) soap before surgery.  CHG is an antiseptic cleaner which kills germs and bonds with the skin to continue killing germs even after washing. Please DO NOT use if you have an allergy to CHG or antibacterial soaps.  If your skin becomes reddened/irritated stop using the CHG and inform your nurse when you arrive at Short Stay. Do not shave (including legs and underarms) for at least 48 hours prior to the first CHG shower.  You may shave your face/neck. Please follow these instructions carefully:  1.  Shower with CHG Soap the night before surgery and the  morning of Surgery.  2.  If you choose to wash your hair, wash your hair first as usual with your  normal  shampoo.  3.  After you shampoo, rinse your hair and body thoroughly to remove the  shampoo.                           4.  Use CHG as you would any other liquid soap.  You can apply chg directly  to the skin and wash                       Gently with a scrungie or clean washcloth.  5.  Apply the CHG Soap to your body ONLY FROM THE NECK DOWN.   Do not use  on face/ open                           Wound or open sores. Avoid contact with eyes, ears mouth and genitals (private parts).                       Wash face,  Genitals (private parts) with your normal soap.             6.  Wash thoroughly, paying special attention to the area where your surgery  will be performed.  7.  Thoroughly rinse your body with warm water from the neck down.  8.  DO NOT shower/wash with your normal soap after using and rinsing off  the CHG Soap.                9.  Pat yourself dry with a clean towel.            10.  Wear clean pajamas.            11.  Place clean sheets on your bed the night of your first shower and do not  sleep with pets. Day of Surgery : Do not apply any lotions/deodorants the morning of surgery.  Please wear clean clothes to the hospital/surgery center.  FAILURE TO FOLLOW THESE INSTRUCTIONS MAY RESULT IN THE CANCELLATION OF YOUR SURGERY PATIENT SIGNATURE_________________________________  NURSE SIGNATURE__________________________________  ________________________________________________________________________

## 2021-09-18 ENCOUNTER — Other Ambulatory Visit: Payer: Self-pay

## 2021-09-18 ENCOUNTER — Encounter (HOSPITAL_COMMUNITY)
Admission: RE | Admit: 2021-09-18 | Discharge: 2021-09-18 | Disposition: A | Discharge status: Home / Self Care | Payer: BC Managed Care – PPO | Source: Ambulatory Visit | Attending: General Surgery | Admitting: General Surgery

## 2021-09-18 ENCOUNTER — Encounter (HOSPITAL_COMMUNITY): Payer: Self-pay

## 2021-09-18 VITALS — BP 137/93 | HR 89 | Temp 98.5°F | Resp 16 | Ht 65.0 in | Wt 251.0 lb

## 2021-09-18 DIAGNOSIS — Z01812 Encounter for preprocedural laboratory examination: Secondary | ICD-10-CM | POA: Diagnosis not present

## 2021-09-18 DIAGNOSIS — Z20822 Contact with and (suspected) exposure to covid-19: Secondary | ICD-10-CM | POA: Diagnosis not present

## 2021-09-18 DIAGNOSIS — Z01818 Encounter for other preprocedural examination: Secondary | ICD-10-CM

## 2021-09-18 HISTORY — DX: Fibromyalgia: M79.7

## 2021-09-18 HISTORY — DX: Cardiac murmur, unspecified: R01.1

## 2021-09-18 HISTORY — DX: Pneumonia, unspecified organism: J18.9

## 2021-09-18 HISTORY — DX: Gastro-esophageal reflux disease without esophagitis: K21.9

## 2021-09-18 HISTORY — DX: Benign intracranial hypertension: G93.2

## 2021-09-18 HISTORY — DX: Anemia, unspecified: D64.9

## 2021-09-18 LAB — COMPREHENSIVE METABOLIC PANEL
ALT: 27 U/L (ref 0–44)
AST: 18 U/L (ref 15–41)
Albumin: 3.9 g/dL (ref 3.5–5.0)
Alkaline Phosphatase: 64 U/L (ref 38–126)
Anion gap: 6 (ref 5–15)
BUN: 18 mg/dL (ref 6–20)
CO2: 20 mmol/L — ABNORMAL LOW (ref 22–32)
Calcium: 8.8 mg/dL — ABNORMAL LOW (ref 8.9–10.3)
Chloride: 110 mmol/L (ref 98–111)
Creatinine, Ser: 0.75 mg/dL (ref 0.44–1.00)
GFR, Estimated: >60 mL/min (ref >60)
Glucose, Bld: 93 mg/dL (ref 70–99)
Potassium: 3.9 mmol/L (ref 3.5–5.1)
Sodium: 136 mmol/L (ref 135–145)
Total Bilirubin: 0.2 mg/dL — ABNORMAL LOW (ref 0.3–1.2)
Total Protein: 7 g/dL (ref 6.5–8.1)

## 2021-09-18 LAB — TYPE AND SCREEN
ABO/RH(D): O POS
Antibody Screen: NEGATIVE

## 2021-09-18 LAB — CBC WITH DIFFERENTIAL/PLATELET
Abs Immature Granulocytes: 0.01 10*3/uL (ref 0.00–0.07)
Basophils Absolute: 0 10*3/uL (ref 0.0–0.1)
Basophils Relative: 1 %
Eosinophils Absolute: 0.1 10*3/uL (ref 0.0–0.5)
Eosinophils Relative: 2 %
HCT: 42.7 % (ref 36.0–46.0)
Hemoglobin: 14.1 g/dL (ref 12.0–15.0)
Immature Granulocytes: 0 %
Lymphocytes Relative: 35 %
Lymphs Abs: 2.2 10*3/uL (ref 0.7–4.0)
MCH: 29.5 pg (ref 26.0–34.0)
MCHC: 33 g/dL (ref 30.0–36.0)
MCV: 89.3 fL (ref 80.0–100.0)
Monocytes Absolute: 0.4 10*3/uL (ref 0.1–1.0)
Monocytes Relative: 6 %
Neutro Abs: 3.5 10*3/uL (ref 1.7–7.7)
Neutrophils Relative %: 56 %
Platelets: 253 10*3/uL (ref 150–400)
RBC: 4.78 MIL/uL (ref 3.87–5.11)
RDW: 12.7 % (ref 11.5–15.5)
WBC: 6.2 10*3/uL (ref 4.0–10.5)
nRBC: 0 % (ref 0.0–0.2)

## 2021-09-29 ENCOUNTER — Other Ambulatory Visit: Payer: Self-pay

## 2021-09-29 ENCOUNTER — Encounter (HOSPITAL_COMMUNITY)
Admission: RE | Admit: 2021-09-29 | Discharge: 2021-09-29 | Disposition: A | Discharge status: Home / Self Care | Payer: BC Managed Care – PPO | Source: Ambulatory Visit | Attending: General Surgery | Admitting: General Surgery

## 2021-09-29 DIAGNOSIS — Z20822 Contact with and (suspected) exposure to covid-19: Secondary | ICD-10-CM | POA: Diagnosis not present

## 2021-09-29 DIAGNOSIS — Z01812 Encounter for preprocedural laboratory examination: Secondary | ICD-10-CM | POA: Diagnosis not present

## 2021-09-29 DIAGNOSIS — Z1152 Encounter for screening for COVID-19: Secondary | ICD-10-CM

## 2021-09-29 LAB — SARS CORONAVIRUS 2 (TAT 6-24 HRS): SARS Coronavirus 2: NEGATIVE

## 2021-10-02 NOTE — Anesthesia Preprocedure Evaluation (Addendum)
Anesthesia Evaluation  ?Patient identified by MRN, date of birth, ID band ?Patient awake ? ? ? ?Reviewed: ?Allergy & Precautions, NPO status , Patient's Chart, lab work & pertinent test results ? ?Airway ?Mallampati: I ? ?TM Distance: >3 FB ?Neck ROM: Full ? ? ? Dental ?no notable dental hx. ?(+) Teeth Intact, Dental Advisory Given ?  ?Pulmonary ?asthma , former smoker,  ?  ?Pulmonary exam normal ?breath sounds clear to auscultation ? ? ? ? ? ? Cardiovascular ?negative cardio ROS ?Normal cardiovascular exam ?Rhythm:Regular Rate:Normal ? ? ?  ?Neuro/Psych ? Headaches, PSYCHIATRIC DISORDERS Anxiety Depression Idiopathic intracranial hypertension ?  ? GI/Hepatic ?Neg liver ROS, GERD  ,  ?Endo/Other  ?Morbid obesity ? Renal/GU ?negative Renal ROS  ?negative genitourinary ?  ?Musculoskeletal ? ?(+) Fibromyalgia - ? Abdominal ?  ?Peds ? Hematology ?negative hematology ROS ?(+)   ?Anesthesia Other Findings ? ? Reproductive/Obstetrics ? ?  ? ? ? ? ? ? ? ? ? ? ? ? ? ?  ?  ? ? ? ? ? ? ? ?Anesthesia Physical ?Anesthesia Plan ? ?ASA: 3 ? ?Anesthesia Plan: General  ? ?Post-op Pain Management: Tylenol PO (pre-op)*, Lidocaine infusion* and Ketamine IV*  ? ?Induction: Intravenous ? ?PONV Risk Score and Plan: 3 and Midazolam, Dexamethasone and Ondansetron ? ?Airway Management Planned: Oral ETT ? ?Additional Equipment:  ? ?Intra-op Plan:  ? ?Post-operative Plan: Extubation in OR ? ?Informed Consent: I have reviewed the patients History and Physical, chart, labs and discussed the procedure including the risks, benefits and alternatives for the proposed anesthesia with the patient or authorized representative who has indicated his/her understanding and acceptance.  ? ? ? ?Dental advisory given ? ?Plan Discussed with: CRNA ? ?Anesthesia Plan Comments:   ? ? ? ? ?Anesthesia Quick Evaluation ? ?

## 2021-10-03 ENCOUNTER — Encounter (HOSPITAL_COMMUNITY)
Admission: RE | Disposition: A | Discharge status: Home / Self Care | Payer: Self-pay | Source: Ambulatory Visit | Attending: General Surgery

## 2021-10-03 ENCOUNTER — Inpatient Hospital Stay (HOSPITAL_COMMUNITY): Payer: BC Managed Care – PPO | Admitting: Certified Registered"

## 2021-10-03 ENCOUNTER — Inpatient Hospital Stay (HOSPITAL_COMMUNITY): Payer: BC Managed Care – PPO | Admitting: Physician Assistant

## 2021-10-03 ENCOUNTER — Other Ambulatory Visit: Payer: Self-pay

## 2021-10-03 ENCOUNTER — Encounter (HOSPITAL_COMMUNITY): Payer: Self-pay | Admitting: General Surgery

## 2021-10-03 ENCOUNTER — Inpatient Hospital Stay (HOSPITAL_COMMUNITY)
Admission: RE | Admit: 2021-10-03 | Discharge: 2021-10-04 | DRG: 620 | Disposition: A | Discharge status: Home / Self Care | Payer: BC Managed Care – PPO | Source: Ambulatory Visit | Attending: General Surgery | Admitting: General Surgery

## 2021-10-03 DIAGNOSIS — G8929 Other chronic pain: Secondary | ICD-10-CM | POA: Diagnosis present

## 2021-10-03 DIAGNOSIS — Z6841 Body Mass Index (BMI) 40.0 and over, adult: Secondary | ICD-10-CM | POA: Diagnosis not present

## 2021-10-03 DIAGNOSIS — K219 Gastro-esophageal reflux disease without esophagitis: Secondary | ICD-10-CM | POA: Diagnosis present

## 2021-10-03 DIAGNOSIS — Z880 Allergy status to penicillin: Secondary | ICD-10-CM

## 2021-10-03 DIAGNOSIS — Z79899 Other long term (current) drug therapy: Secondary | ICD-10-CM

## 2021-10-03 DIAGNOSIS — F3130 Bipolar disorder, current episode depressed, mild or moderate severity, unspecified: Secondary | ICD-10-CM | POA: Diagnosis present

## 2021-10-03 DIAGNOSIS — Z87891 Personal history of nicotine dependence: Secondary | ICD-10-CM | POA: Diagnosis not present

## 2021-10-03 DIAGNOSIS — Z01818 Encounter for other preprocedural examination: Secondary | ICD-10-CM

## 2021-10-03 DIAGNOSIS — Z9884 Bariatric surgery status: Principal | ICD-10-CM

## 2021-10-03 DIAGNOSIS — J45909 Unspecified asthma, uncomplicated: Secondary | ICD-10-CM | POA: Diagnosis present

## 2021-10-03 DIAGNOSIS — E559 Vitamin D deficiency, unspecified: Secondary | ICD-10-CM | POA: Diagnosis present

## 2021-10-03 HISTORY — PX: UPPER GI ENDOSCOPY: SHX6162

## 2021-10-03 HISTORY — PX: LAPAROSCOPIC GASTRIC SLEEVE RESECTION: SHX5895

## 2021-10-03 LAB — HEMOGLOBIN AND HEMATOCRIT, BLOOD
HCT: 37.2 % (ref 36.0–46.0)
Hemoglobin: 12.2 g/dL (ref 12.0–15.0)

## 2021-10-03 LAB — PREGNANCY, URINE: Preg Test, Ur: NEGATIVE

## 2021-10-03 SURGERY — GASTRECTOMY, SLEEVE, LAPAROSCOPIC
Anesthesia: General | Site: Abdomen

## 2021-10-03 MED ORDER — FENTANYL CITRATE (PF) 100 MCG/2ML IJ SOLN
INTRAMUSCULAR | Status: AC
  Filled 2021-10-03: qty 2

## 2021-10-03 MED ORDER — ENSURE MAX PROTEIN PO LIQD
2.0000 [oz_av] | ORAL | Status: DC
  Administered 2021-10-04 (×4): 2 [oz_av] via ORAL

## 2021-10-03 MED ORDER — GENTAMICIN IN SALINE 1.2-0.9 MG/ML-% IV SOLN
120.0000 mg | INTRAVENOUS | Status: DC
  Filled 2021-10-03: qty 100

## 2021-10-03 MED ORDER — HEPARIN SODIUM (PORCINE) 5000 UNIT/ML IJ SOLN
5000.0000 [IU] | INTRAMUSCULAR | Status: AC
  Administered 2021-10-03: 5000 [IU] via SUBCUTANEOUS
  Filled 2021-10-03: qty 1

## 2021-10-03 MED ORDER — BUPIVACAINE LIPOSOME 1.3 % IJ SUSP
INTRAMUSCULAR | Status: DC | PRN
  Administered 2021-10-03: 20 mL

## 2021-10-03 MED ORDER — ONDANSETRON HCL 4 MG/2ML IJ SOLN
INTRAMUSCULAR | Status: AC
  Filled 2021-10-03: qty 2

## 2021-10-03 MED ORDER — DEXAMETHASONE SODIUM PHOSPHATE 10 MG/ML IJ SOLN
INTRAMUSCULAR | Status: AC
  Filled 2021-10-03: qty 1

## 2021-10-03 MED ORDER — BUPIVACAINE LIPOSOME 1.3 % IJ SUSP
INTRAMUSCULAR | Status: AC
  Filled 2021-10-03: qty 20

## 2021-10-03 MED ORDER — LACTATED RINGERS IV SOLN: INTRAVENOUS | Status: DC

## 2021-10-03 MED ORDER — LIDOCAINE 2% (20 MG/ML) 5 ML SYRINGE
INTRAMUSCULAR | Status: DC | PRN
Start: 2021-10-03 — End: 2021-10-03
  Administered 2021-10-03: 40 mg via INTRAVENOUS

## 2021-10-03 MED ORDER — CLINDAMYCIN PHOSPHATE 900 MG/50ML IV SOLN
900.0000 mg | INTRAVENOUS | Status: AC
  Administered 2021-10-03: 900 mg via INTRAVENOUS
  Filled 2021-10-03: qty 50

## 2021-10-03 MED ORDER — DEXAMETHASONE SODIUM PHOSPHATE 10 MG/ML IJ SOLN
INTRAMUSCULAR | Status: DC | PRN
  Administered 2021-10-03: 8 mg via INTRAVENOUS

## 2021-10-03 MED ORDER — HYDRALAZINE HCL 20 MG/ML IJ SOLN: 10.0000 mg | INTRAMUSCULAR | Status: DC | PRN

## 2021-10-03 MED ORDER — SUGAMMADEX SODIUM 200 MG/2ML IV SOLN
INTRAVENOUS | Status: DC | PRN
Start: 2021-10-03 — End: 2021-10-03
  Administered 2021-10-03: 230 mg via INTRAVENOUS

## 2021-10-03 MED ORDER — PANTOPRAZOLE SODIUM 40 MG IV SOLR
40.0000 mg | Freq: Every day | INTRAVENOUS | Status: DC
  Administered 2021-10-03: 40 mg via INTRAVENOUS
  Filled 2021-10-03: qty 10

## 2021-10-03 MED ORDER — OXYCODONE HCL 5 MG/5ML PO SOLN: 5.0000 mg | Freq: Four times a day (QID) | ORAL | Status: DC | PRN

## 2021-10-03 MED ORDER — FENTANYL CITRATE PF 50 MCG/ML IJ SOSY: 25.0000 ug | PREFILLED_SYRINGE | INTRAMUSCULAR | Status: DC | PRN

## 2021-10-03 MED ORDER — APREPITANT 40 MG PO CAPS
40.0000 mg | ORAL_CAPSULE | ORAL | Status: AC
  Administered 2021-10-03: 40 mg via ORAL
  Filled 2021-10-03: qty 1

## 2021-10-03 MED ORDER — EPHEDRINE 5 MG/ML INJ
INTRAVENOUS | Status: AC
  Filled 2021-10-03: qty 5

## 2021-10-03 MED ORDER — ONDANSETRON HCL 4 MG/2ML IJ SOLN
INTRAMUSCULAR | Status: DC | PRN
Start: 2021-10-03 — End: 2021-10-03
  Administered 2021-10-03: 4 mg via INTRAVENOUS

## 2021-10-03 MED ORDER — MORPHINE SULFATE (PF) 2 MG/ML IV SOLN
1.0000 mg | INTRAVENOUS | Status: DC | PRN
  Administered 2021-10-03: 1 mg via INTRAVENOUS

## 2021-10-03 MED ORDER — BUPIVACAINE LIPOSOME 1.3 % IJ SUSP: 20.0000 mL | Freq: Once | INTRAMUSCULAR | Status: DC

## 2021-10-03 MED ORDER — DEXAMETHASONE SODIUM PHOSPHATE 4 MG/ML IJ SOLN: 4.0000 mg | INTRAMUSCULAR | Status: DC

## 2021-10-03 MED ORDER — ACETAMINOPHEN 500 MG PO TABS
1000.0000 mg | ORAL_TABLET | Freq: Once | ORAL | Status: AC
  Administered 2021-10-03: 1000 mg via ORAL

## 2021-10-03 MED ORDER — CHLORHEXIDINE GLUCONATE 4 % EX LIQD: 60.0000 mL | Freq: Once | CUTANEOUS | Status: DC

## 2021-10-03 MED ORDER — SODIUM CHLORIDE (PF) 0.9 % IJ SOLN
INTRAMUSCULAR | Status: AC
  Filled 2021-10-03: qty 50

## 2021-10-03 MED ORDER — MIDAZOLAM HCL 2 MG/2ML IJ SOLN
INTRAMUSCULAR | Status: DC | PRN
  Administered 2021-10-03: 2 mg via INTRAVENOUS

## 2021-10-03 MED ORDER — PROPOFOL 10 MG/ML IV BOLUS
INTRAVENOUS | Status: AC
  Filled 2021-10-03: qty 20

## 2021-10-03 MED ORDER — SUGAMMADEX SODIUM 500 MG/5ML IV SOLN
INTRAVENOUS | Status: AC
  Filled 2021-10-03: qty 5

## 2021-10-03 MED ORDER — ACETAMINOPHEN 160 MG/5ML PO SOLN
1000.0000 mg | Freq: Three times a day (TID) | ORAL | Status: DC
  Administered 2021-10-03: 1000 mg via ORAL
  Filled 2021-10-03: qty 40.6

## 2021-10-03 MED ORDER — ACETAMINOPHEN 500 MG PO TABS
1000.0000 mg | ORAL_TABLET | Freq: Three times a day (TID) | ORAL | Status: DC
  Administered 2021-10-03 – 2021-10-04 (×3): 1000 mg via ORAL
  Filled 2021-10-03 (×4): qty 2

## 2021-10-03 MED ORDER — EPHEDRINE SULFATE-NACL 50-0.9 MG/10ML-% IV SOSY
PREFILLED_SYRINGE | INTRAVENOUS | Status: DC | PRN
Start: 2021-10-03 — End: 2021-10-03
  Administered 2021-10-03 (×3): 5 mg via INTRAVENOUS

## 2021-10-03 MED ORDER — SIMETHICONE 80 MG PO CHEW: 80.0000 mg | CHEWABLE_TABLET | Freq: Four times a day (QID) | ORAL | Status: DC | PRN

## 2021-10-03 MED ORDER — PROPOFOL 10 MG/ML IV BOLUS
INTRAVENOUS | Status: AC
Start: 2021-10-03 — End: ?
  Filled 2021-10-03: qty 20

## 2021-10-03 MED ORDER — ROCURONIUM BROMIDE 10 MG/ML (PF) SYRINGE
PREFILLED_SYRINGE | INTRAVENOUS | Status: AC
  Filled 2021-10-03: qty 10

## 2021-10-03 MED ORDER — STERILE WATER FOR IRRIGATION IR SOLN
Status: DC | PRN
Start: 2021-10-03 — End: 2021-10-03
  Administered 2021-10-03: 1000 mL

## 2021-10-03 MED ORDER — FENTANYL CITRATE (PF) 250 MCG/5ML IJ SOLN
INTRAMUSCULAR | Status: DC | PRN
  Administered 2021-10-03: 25 ug via INTRAVENOUS
  Administered 2021-10-03: 100 ug via INTRAVENOUS

## 2021-10-03 MED ORDER — MORPHINE SULFATE (PF) 2 MG/ML IV SOLN
INTRAVENOUS | Status: AC
  Filled 2021-10-03: qty 1

## 2021-10-03 MED ORDER — ENOXAPARIN SODIUM 30 MG/0.3ML IJ SOSY
30.0000 mg | PREFILLED_SYRINGE | Freq: Two times a day (BID) | INTRAMUSCULAR | Status: DC
  Administered 2021-10-03 – 2021-10-04 (×3): 30 mg via SUBCUTANEOUS
  Filled 2021-10-03 (×3): qty 0.3

## 2021-10-03 MED ORDER — ACETAMINOPHEN 500 MG PO TABS: 1000.0000 mg | ORAL_TABLET | ORAL | Status: DC

## 2021-10-03 MED ORDER — SCOPOLAMINE 1 MG/3DAYS TD PT72
1.0000 | MEDICATED_PATCH | TRANSDERMAL | Status: DC
  Administered 2021-10-03: 1.5 mg via TRANSDERMAL
  Filled 2021-10-03: qty 1

## 2021-10-03 MED ORDER — KCL IN DEXTROSE-NACL 20-5-0.45 MEQ/L-%-% IV SOLN
INTRAVENOUS | Status: DC
  Filled 2021-10-03 (×3): qty 1000

## 2021-10-03 MED ORDER — MIDAZOLAM HCL 2 MG/2ML IJ SOLN
INTRAMUSCULAR | Status: AC
  Filled 2021-10-03: qty 2

## 2021-10-03 MED ORDER — ROCURONIUM BROMIDE 10 MG/ML (PF) SYRINGE
PREFILLED_SYRINGE | INTRAVENOUS | Status: DC | PRN
  Administered 2021-10-03: 100 mg via INTRAVENOUS

## 2021-10-03 MED ORDER — GENTAMICIN SULFATE 40 MG/ML IJ SOLN
120.0000 mg | INTRAVENOUS | Status: AC
  Administered 2021-10-03: 120 mg via INTRAVENOUS
  Filled 2021-10-03: qty 3

## 2021-10-03 MED ORDER — 0.9 % SODIUM CHLORIDE (POUR BTL) OPTIME
TOPICAL | Status: DC | PRN
  Administered 2021-10-03: 1000 mL

## 2021-10-03 MED ORDER — ONDANSETRON HCL 4 MG/2ML IJ SOLN
4.0000 mg | Freq: Four times a day (QID) | INTRAMUSCULAR | Status: DC | PRN
  Administered 2021-10-03 – 2021-10-04 (×4): 4 mg via INTRAVENOUS
  Filled 2021-10-03 (×4): qty 2

## 2021-10-03 MED ORDER — PROCHLORPERAZINE EDISYLATE 10 MG/2ML IJ SOLN
10.0000 mg | Freq: Four times a day (QID) | INTRAMUSCULAR | Status: DC | PRN
  Administered 2021-10-03 – 2021-10-04 (×3): 10 mg via INTRAVENOUS
  Filled 2021-10-03 (×3): qty 2

## 2021-10-03 MED ORDER — LACTATED RINGERS IR SOLN
Status: DC | PRN
  Administered 2021-10-03: 1000 mL

## 2021-10-03 MED ORDER — LAMOTRIGINE 100 MG PO TABS
100.0000 mg | ORAL_TABLET | Freq: Every day | ORAL | Status: DC
  Administered 2021-10-04: 100 mg via ORAL
  Filled 2021-10-03: qty 1

## 2021-10-03 MED ORDER — GENTAMICIN SULFATE 40 MG/ML IJ SOLN
120.0000 mg | INTRAVENOUS | Status: DC
  Filled 2021-10-03: qty 3

## 2021-10-03 MED ORDER — SODIUM CHLORIDE (PF) 0.9 % IJ SOLN
INTRAMUSCULAR | Status: DC | PRN
  Administered 2021-10-03: 50 mL

## 2021-10-03 MED ORDER — PROPOFOL 10 MG/ML IV BOLUS
INTRAVENOUS | Status: DC | PRN
  Administered 2021-10-03: 150 mg via INTRAVENOUS

## 2021-10-03 SURGICAL SUPPLY — 81 items
APPLICATOR COTTON TIP 6 STRL (MISCELLANEOUS) IMPLANT
APPLICATOR COTTON TIP 6IN STRL (MISCELLANEOUS)
APPLIER CLIP ROT 10 11.4 M/L (STAPLE)
APPLIER CLIP ROT 13.4 12 LRG (CLIP)
BAG COUNTER SPONGE SURGICOUNT (BAG) IMPLANT
BAG SURGICOUNT SPONGE COUNTING (BAG)
BLADE SURG SZ11 CARB STEEL (BLADE) ×4 IMPLANT
CABLE HIGH FREQUENCY MONO STRZ (ELECTRODE) IMPLANT
CHLORAPREP W/TINT 26 (MISCELLANEOUS) ×8 IMPLANT
CLIP APPLIE ROT 10 11.4 M/L (STAPLE) IMPLANT
CLIP APPLIE ROT 13.4 12 LRG (CLIP) IMPLANT
CLOSURE WOUND 1/2 X4 (GAUZE/BANDAGES/DRESSINGS) ×1
COVER SURGICAL LIGHT HANDLE (MISCELLANEOUS) ×4 IMPLANT
DEVICE SUT QUICK LOAD TK 5 (STAPLE) IMPLANT
DEVICE SUT TI-KNOT TK 5X26 (MISCELLANEOUS) IMPLANT
DEVICE SUTURE ENDOST 10MM (ENDOMECHANICALS) IMPLANT
DEVICE TI KNOT TK5 (MISCELLANEOUS)
DISSECTOR BLUNT TIP ENDO 5MM (MISCELLANEOUS) IMPLANT
DRAPE UTILITY XL STRL (DRAPES) ×8 IMPLANT
DRSG TEGADERM 2-3/8X2-3/4 SM (GAUZE/BANDAGES/DRESSINGS) ×24 IMPLANT
ELECT L-HOOK LAP 45CM DISP (ELECTROSURGICAL)
ELECT REM PT RETURN 15FT ADLT (MISCELLANEOUS) ×4 IMPLANT
ELECTRODE L-HOOK LAP 45CM DISP (ELECTROSURGICAL) IMPLANT
GAUZE SPONGE 2X2 8PLY STRL LF (GAUZE/BANDAGES/DRESSINGS) IMPLANT
GAUZE SPONGE 4X4 12PLY STRL (GAUZE/BANDAGES/DRESSINGS) IMPLANT
GLOVE SRG 8 PF TXTR STRL LF DI (GLOVE) ×2 IMPLANT
GLOVE SURG POLY ORTHO LF SZ7.5 (GLOVE) ×4 IMPLANT
GLOVE SURG UNDER POLY LF SZ8 (GLOVE) ×2
GOWN STRL REUS W/ TWL XL LVL3 (GOWN DISPOSABLE) IMPLANT
GOWN STRL REUS W/TWL XL LVL3 (GOWN DISPOSABLE) ×12 IMPLANT
GRASPER SUT TROCAR 14GX15 (MISCELLANEOUS) ×4 IMPLANT
IRRIG SUCT STRYKERFLOW 2 WTIP (MISCELLANEOUS) ×4
IRRIGATION SUCT STRKRFLW 2 WTP (MISCELLANEOUS) ×2 IMPLANT
KIT BASIN OR (CUSTOM PROCEDURE TRAY) ×4 IMPLANT
KIT TURNOVER KIT A (KITS) IMPLANT
MARKER SKIN DUAL TIP RULER LAB (MISCELLANEOUS) ×4 IMPLANT
MAT PREVALON FULL STRYKER (MISCELLANEOUS) ×4 IMPLANT
NDL SPNL 22GX3.5 QUINCKE BK (NEEDLE) ×2 IMPLANT
NEEDLE SPNL 22GX3.5 QUINCKE BK (NEEDLE) ×4 IMPLANT
PACK UNIVERSAL I (CUSTOM PROCEDURE TRAY) ×4 IMPLANT
PENCIL SMOKE EVACUATOR (MISCELLANEOUS) IMPLANT
QUICK LOAD TK 5 (STAPLE)
RELOAD STAPLE 60 3.6 BLU REG (STAPLE) ×2 IMPLANT
RELOAD STAPLE 60 3.8 GOLD REG (STAPLE) IMPLANT
RELOAD STAPLE 60 4.1 GRN THCK (STAPLE) ×2 IMPLANT
RELOAD STAPLE 60 BLK VRY/THCK (STAPLE) IMPLANT
RELOAD STAPLER 60MM BLK (STAPLE) IMPLANT
RELOAD STAPLER BLUE 60MM (STAPLE) ×10 IMPLANT
RELOAD STAPLER GOLD 60MM (STAPLE) IMPLANT
RELOAD STAPLER GREEN 60MM (STAPLE) ×2 IMPLANT
SCISSORS LAP 5X45 EPIX DISP (ENDOMECHANICALS) IMPLANT
SEALANT SURGICAL APPL DUAL CAN (MISCELLANEOUS) IMPLANT
SET TUBE SMOKE EVAC HIGH FLOW (TUBING) ×4 IMPLANT
SHEARS HARMONIC ACE PLUS 45CM (MISCELLANEOUS) ×4 IMPLANT
SLEEVE GASTRECTOMY 40FR VISIGI (MISCELLANEOUS) ×4 IMPLANT
SLEEVE XCEL OPT CAN 5 100 (ENDOMECHANICALS) ×12 IMPLANT
SOL ANTI FOG 6CC (MISCELLANEOUS) ×2 IMPLANT
SOLUTION ANTI FOG 6CC (MISCELLANEOUS) ×2
SPIKE FLUID TRANSFER (MISCELLANEOUS) ×4 IMPLANT
SPONGE GAUZE 2X2 STER 10/PKG (GAUZE/BANDAGES/DRESSINGS)
SPONGE T-LAP 18X18 ~~LOC~~+RFID (SPONGE) ×4 IMPLANT
STAPLE LINE REINFORCEMENT LAP (STAPLE) ×10 IMPLANT
STAPLER ECHELON BIOABSB 60 FLE (MISCELLANEOUS) IMPLANT
STAPLER ECHELON LONG 60 440 (INSTRUMENTS) ×4 IMPLANT
STAPLER RELOAD 60MM BLK (STAPLE)
STAPLER RELOAD BLUE 60MM (STAPLE) ×20
STAPLER RELOAD GOLD 60MM (STAPLE)
STAPLER RELOAD GREEN 60MM (STAPLE) ×4
STRIP CLOSURE SKIN 1/2X4 (GAUZE/BANDAGES/DRESSINGS) ×3 IMPLANT
SUT MNCRL AB 4-0 PS2 18 (SUTURE) ×4 IMPLANT
SUT SURGIDAC NAB ES-9 0 48 120 (SUTURE) IMPLANT
SUT VICRYL 0 TIES 12 18 (SUTURE) ×4 IMPLANT
SYR 20ML LL LF (SYRINGE) ×4 IMPLANT
SYR 50ML LL SCALE MARK (SYRINGE) ×4 IMPLANT
TOWEL OR 17X26 10 PK STRL BLUE (TOWEL DISPOSABLE) ×4 IMPLANT
TOWEL OR NON WOVEN STRL DISP B (DISPOSABLE) ×4 IMPLANT
TROCAR BLADELESS 15MM (ENDOMECHANICALS) ×4 IMPLANT
TROCAR BLADELESS OPT 5 100 (ENDOMECHANICALS) ×4 IMPLANT
TUBING CONNECTING 10 (TUBING) ×6 IMPLANT
TUBING CONNECTING 10' (TUBING) ×2
TUBING ENDO SMARTCAP (MISCELLANEOUS) ×4 IMPLANT

## 2021-10-03 NOTE — Op Note (Signed)
Preoperative diagnosis: laparoscopic sleeve gastrectomy  Postoperative diagnosis: Same   Procedure: Upper endoscopy   Surgeon: Jazaria Jarecki A Nur Rabold, M.D.  Anesthesia: Gen.   Description of procedure: The endoscope was placed in the mouth and oropharynx and under endoscopic vision it was advanced to the esophagogastric junction which was identified at 38cm from the teeth.  The pouch was tensely insufflated while the upper abdomen was flooded with irrigation to perform a leak test, which was negative. No bubbles were seen.  The staple line was hemostatic and the lumen was evenly tubular without undue narrowing, angulation or twisting specifically at the incisura angularis. The lumen was decompressed and the scope was withdrawn without difficulty.    Abbigael Detlefsen A Bianna Haran, M.D. General, Bariatric, & Minimally Invasive Surgery [facility]   

## 2021-10-03 NOTE — Progress Notes (Signed)

## 2021-10-03 NOTE — Discharge Instructions (Signed)
GASTRIC BYPASS / SLEEVE  °Home Care Instructions ° °These instructions are to help you care for yourself when you go home. ° °Call: If you have any problems. °Call [phone] and ask for the surgeon on call °If you have an emergency related to your surgery please use the ER at [facility].  °Tell the ER staff that you are a new post-op gastric bypass or gastric sleeve patient °  °Signs and symptoms to report: Severe vomiting or nausea °If you cannot handle clear liquids for longer than 1 day, call your surgeon  °Abdominal pain which does not get better after taking your pain medication °Fever greater than 100.4° F and chills °Heart rate over 100 beats a minute °Trouble breathing °Chest pain ° Redness, swelling, drainage, or foul odor at incision (surgical) sites ° If your incisions open or pull apart °Swelling or pain in calf (lower leg) °Diarrhea (Loose bowel movements that happen often), frequent watery, uncontrolled bowel movements °Constipation, (no bowel movements for 3 days) if this happens:  °Take Milk of Magnesia, 2 tablespoons by mouth, 3 times a day for 2 days if needed °Stop taking Milk of Magnesia once you have had a bowel movement °Call your doctor if constipation continues °Or °Take Miralax  (instead of Milk of Magnesia) following the label instructions °Stop taking Miralax once you have had a bowel movement °Call your doctor if constipation continues °Anything you think is “abnormal for you” °  °Normal side effects after surgery: Unable to sleep at night or unable to concentrate °Irritability °Being tearful (crying) or depressed °These are common complaints, possibly related to your anesthesia, stress of surgery and change in lifestyle, that usually go away a few weeks after surgery.  If these feelings continue, call your medical doctor.  °Wound Care: You may have surgical glue, steri-strips, or staples over your incisions after surgery °Surgical glue:  Looks like a clear film over your incisions  and will wear off a little at a time °Steri-strips : Adhesive strips of tape over your incisions. You may notice a yellowish color on the skin under the steri-strips. This is used to make the   steri-strips stick better. Do not pull the steri-strips off - let them fall off °Staples: Staples may be removed before you leave the hospital °If you go home with staples, call [facility] at for an appointment with your surgeon’s nurse to have staples removed 10 days after surgery, [phone] °Showering: You may shower two (2) days after your surgery unless your surgeon tells you differently °Wash gently around incisions with warm soapy water, rinse well, and gently pat dry  °If you have a drain (tube from your incision), you may need someone to hold this while you shower  °No tub baths until staples are removed and incisions are healed   °  °Medications: Medications should be liquid or crushed if larger than the size of a dime °Extended release pills (medication that releases a little bit at a time through the day) should not be crushed °Depending on the size and number of medications you take, you may need to space (take a few throughout the day)/change the time you take your medications so that you do not over-fill your pouch (smaller stomach) °Make sure you follow-up with your primary care physician to make medication changes needed during rapid weight loss and life-style changes °If you have diabetes, follow up with the doctor that orders your diabetes medication(s) within one week after surgery and check   your blood sugar regularly. °Do not drive while taking narcotics (pain medications) °DO NOT take NSAID'S (Examples of NSAID's include ibuprofen, naproxen)  °Diet:                    First 2 Weeks ° You will see the nutritionist about two (2) weeks after your surgery. The nutritionist will increase the types of foods you can eat if you are handling liquids well: °If you have severe vomiting or nausea  and cannot handle clear liquids lasting longer than 1 day, call your surgeon  °Protein Shake °Drink at least 2 ounces of shake 5-6 times per day °Each serving of protein shakes (usually 8 - 12 ounces) should have a minimum of:  °15 grams of protein  °And no more than 5 grams of carbohydrate  °Goal for protein each day: °Men = 80 grams per day °Women = 60 grams per day °Protein powder may be added to fluids such as non-fat milk or Lactaid milk or Soy milk (limit to 35 grams added protein powder per serving) ° °Hydration °Slowly increase the amount of water and other clear liquids as tolerated (See Acceptable Fluids) °Slowly increase the amount of protein shake as tolerated  ° Sip fluids slowly and throughout the day °May use sugar substitutes in small amounts (no more than 6 - 8 packets per day; i.e. Splenda) ° °Fluid Goal °The first goal is to drink at least 8 ounces of protein shake/drink per day (or as directed by the nutritionist);  See handout from pre-op Bariatric Education Class for examples of protein shake/drink.   °Slowly increase the amount of protein shake you drink as tolerated °You may find it easier to slowly sip shakes throughout the day °It is important to get your proteins in first °Your fluid goal is to drink 64 - 100 ounces of fluid daily °It may take a few weeks to build up to this °32 oz (or more) should be clear liquids  °And  °32 oz (or more) should be full liquids (see below for examples) °Liquids should not contain sugar, caffeine, or carbonation ° °Clear Liquids: °Water or Sugar-free flavored water (i.e. Fruit H2O, Propel) °Decaffeinated coffee or tea (sugar-free) °Roger Fasnacht Lite, Wyler’s Lite, Minute Maid Lite °Sugar-free Jell-O °Bouillon or broth °Sugar-free Popsicle:   *Less than 20 calories each; Limit 1 per day ° °Full Liquids: °Protein Shakes/Drinks + 2 choices per day of other full liquids °Full liquids must be: °No More Than 12 grams of Carbs per serving  °No More Than 3 grams of Fat  per serving °Strained low-fat cream soup °Non-Fat milk °Fat-free Lactaid Milk °Sugar-free yogurt (Dannon Lite & Fit, Greek yogurt) ° ° ° °  °Vitamins and Minerals Start 1 day after surgery unless otherwise directed by your surgeon °Bariatric Specific Complete Multivitamins °Chewable Calcium Citrate with Vitamin D-3 °(Example: 3 Chewable Calcium Plus 600 with Vitamin D-3) °Take 500 mg three (3) times a day for a total of 1500 mg each day °Do not take all 3 doses of calcium at one time as it may cause constipation, and you can only absorb 500 mg  at a time  °Do not mix multivitamins containing iron with calcium supplements; take 2 hours apart ° °Menstruating women and those at risk for anemia (a blood disease that causes weakness) may need extra iron °Talk with your doctor to see if you need more iron °If you need extra iron: Total daily Iron recommendation (including Vitamins) is 50 to 100   mg Iron/day °Do not stop taking or change any vitamins or minerals until you talk to your nutritionist or surgeon °Your nutritionist and/or surgeon must approve all vitamin and mineral supplements °  °Activity and Exercise: It is important to continue walking at home.  Limit your physical activity as instructed by your doctor.  During this time, use these guidelines: °Do not lift anything greater than ten (10) pounds for at least two (2) weeks °Do not go back to work or drive until your surgeon says you can °You may have sex when you feel comfortable  °It is VERY important for female patients to use a reliable birth control method; fertility often increases after surgery  °Do not get pregnant for at least 18 months °Start exercising as soon as your doctor tells you that you can °Make sure your doctor approves any physical activity °Start with a simple walking program °Walk 5-15 minutes each day, 7 days per week.  °Slowly increase until you are walking 30-45 minutes per day °Consider joining our BELT program. [phone] or email  [email or web address] °  °Special Instructions Things to remember: ° °Use your CPAP when sleeping if this applies to you, do not stop the use of CPAP unless directed by physician after a sleep study °[facility] has a free Bariatric Surgery Support Group that meets monthly, the 3rd Thursday, 6 pm.  Please review discharge information for date and location of this meeting. °It is very important to keep all follow up appointments with your surgeon, nutritionist, primary care physician, and behavioral health practitioner °After the first year, please follow up with your bariatric surgeon and nutritionist at least once a year in order to maintain best weight loss results ° ° °[facility]: [phone] °[facility]: [phone] °Bariatric Nurse Coordinator: [phone] °  °  °

## 2021-10-03 NOTE — H&P (Signed)
PROVIDER: Hannahmarie Asberry Leanne Chang, MD ? ?MRN: [id number] ?DOB: 1980-12-05 ?DATE OF ENCOUNTER: 09/07/2021 ?Subjective  ? ?Chief Complaint: pre-op weight ? ? ?History of Present Illness: ?Dana Frost is a 41 y.o. female who is seen today for follow-up regarding her severe obesity, her comorbidities include vitamin D deficiency, bilateral foot pain, asthma. She also has bipolar depression. ? ?Denies any medical changes since we initially saw her in October. She denies any trips to the emergency room or hospital. She denies any chest pain, chest pressure, shortness of breath, dyspnea on exertion, paroxysmal nocturnal dyspnea. She denies any GERD, heartburn, indigestion and reflux. She did have some GERD during her pregnancy. Upper GI did show some evidence of reflux. She does not have to take anything. Nor does she have any symptoms. Daily bowel movement.. ? ?Review of Systems: ?A complete review of systems was obtained from the patient. I have reviewed this information and discussed as appropriate with the patient. See HPI as well for other ROS. ? ?ROS  ? ?Medical History: ?Past Medical History:  ?Diagnosis Date  ? Anemia  ? Anxiety  ? Asthma, unspecified asthma severity, unspecified whether complicated, unspecified whether persistent  ? ?There is no problem list on file for this patient. ? ?Past Surgical History:  ?Procedure Laterality Date  ? ankle surgery  ? endoscopy surgery  ? ? ?Allergies  ?Allergen Reactions  ? Penicillins Hives  ? ?Current Outpatient Medications on File Prior to Visit  ?Medication Sig Dispense Refill  ? lamoTRIgine (LAMICTAL) 100 MG tablet lamotrigine 100 mg tablet ?TAKE 1 TABLET BY MOUTH ONCE A DAY (IF OUT OF LAMICTAL MORE THAN 1 WK DO NOT RESTART, CALL MD)  ? ?No current facility-administered medications on file prior to visit.  ? ?Family History  ?Problem Relation Age of Onset  ? Obesity Mother  ? Obesity Father  ? High blood pressure (Hypertension) Father  ? Hyperlipidemia (Elevated cholesterol)  Father  ? Diabetes Father  ? Deep vein thrombosis (DVT or abnormal blood clot formation) Father  ? ? ?Social History  ? ?Tobacco Use  ?Smoking Status Former  ?Smokeless Tobacco Former  ? ? ?Social History  ? ?Socioeconomic History  ? Marital status: Married  ?Tobacco Use  ? Smoking status: Former  ? Smokeless tobacco: Former  ?Vaping Use  ? Vaping Use: Never used  ?Substance and Sexual Activity  ? Alcohol use: Yes  ? Drug use: Never  ? ?Objective:  ? ?Vitals:  ?09/07/21 1455  ?BP: 122/88  ?Pulse: 83  ?Temp: 36.6 ?C (97.9 ?F)  ?SpO2: 100%  ?Weight: (!) 117 kg (258 lb)  ?Height: 165.1 cm (_0 )  ? ?Body mass index is 42.93 kg/m?. ? ?Gen: alert, NAD, non-toxic appearing, severe obesity ?Pupils: equal, no scleral icterus ?Pulm: Lungs clear to auscultation, symmetric chest rise ?CV: regular rate and rhythm ?Abd: soft, nontender, nondistended. No cellulitis. No incisional hernia ?Ext: no edema,  ?Skin: no rash, no jaundice ? ?Labs, Imaging and Diagnostic Testing: ? ?Upper GI on November 8 showed spontaneous GERD no hiatal hernia. Chest x-ray unremarkable, EKG unremarkable ? ?thyroid ultrasound September 2018 showed that none of the nodules met criteria for biopsy or surveillance. ? ?I read a cardiology office note from [facility] from August 2020. ? ?Assessment and Plan:  ?Diagnoses and all orders for this visit: ? ?Severe obesity (CMS-HCC) ? ?Bipolar depression (CMS-HCC) ? ?Chronic pain of both feet ? ?Vitamin D deficiency ? ? ? ?We reviewed her evaluation, labs and imaging. She has received nutritional and  psychological evaluations and clearances. We discussed the finding of some reflux on her upper GI. Currently she has no symptoms. We did discuss the potential long-term risk of reflux after sleeve gastrectomy. She understands this. I still think it is reasonable to proceed. She has attended her preoperative education class. We rediscussed the typical hospitalization and the typical recovery. We discussed the  typical things that we see in the first few weeks after surgery. We discussed the diet transition from liquids to solid proteins. We discussed the importance of physical activity and consistent food choices long-term in order to mitigate weight regain. All of her questions were asked and answered. ? ?Moderate level of medical decision making. She has a chronic illness with progression. I reviewed her upper GI, chest x-ray, labs, nutritional notes, psychological evaluation. ? ?No follow-ups on file. ? ?Leighton Ruff. Redmond Pulling MD FACS ?General, Minimally Invasive, & Bariatric Surgery ?Electronically signed by Rudean Curt, MD at 09/07/2021 3:26 PM EST ? ?

## 2021-10-03 NOTE — Transfer of Care (Signed)
Immediate Anesthesia Transfer of Care Note ? ?Patient: Dana Frost ? ?Procedure(s) Performed: LAPAROSCOPIC GASTRIC SLEEVE RESECTION (Abdomen) ?UPPER GI ENDOSCOPY ? ?Patient Location: PACU ? ?Anesthesia Type:General ? ?Level of Consciousness: awake, alert  and patient cooperative ? ?Airway & Oxygen Therapy: Patient Spontanous Breathing and Patient connected to face mask oxygen ? ?Post-op Assessment: Report given to RN ? ?Post vital signs: Reviewed, VSS ? ?Last Vitals:  ?Vitals Value Taken Time  ?BP 119/87 10/03/21 0910  ?Temp    ?Pulse 48 10/03/21 0911  ?Resp 17 10/03/21 0911  ?SpO2 100 % 10/03/21 0911  ?Vitals shown include unvalidated device data. ? ?Last Pain:  ?Vitals:  ? 10/03/21 0635  ?TempSrc: Oral  ?PainSc: 0-No pain  ?   ? ?  ? ?Complications: No notable events documented. ?

## 2021-10-03 NOTE — Interval H&P Note (Signed)
History and Physical Interval Note: ? ?10/03/2021 ?7:26 AM ? ?Dana Frost  has presented today for surgery, with the diagnosis of morbid obesity.  The various methods of treatment have been discussed with the patient and family. After consideration of risks, benefits and other options for treatment, the patient has consented to  Procedure(s): ?LAPAROSCOPIC GASTRIC SLEEVE RESECTION (N/A) ?UPPER GI ENDOSCOPY (N/A) as a surgical intervention.  The patient's history has been reviewed, patient examined, no change in status, stable for surgery.  I have reviewed the patient's chart and labs.  Questions were answered to the patient's satisfaction.   ? ? ?Gaynelle Adu ? ? ?

## 2021-10-03 NOTE — Anesthesia Postprocedure Evaluation (Signed)
Anesthesia Post Note ? ?Patient: Dana Frost ? ?Procedure(s) Performed: LAPAROSCOPIC GASTRIC SLEEVE RESECTION (Abdomen) ?UPPER GI ENDOSCOPY ? ?  ? ?Patient location during evaluation: PACU ?Anesthesia Type: General ?Level of consciousness: awake and alert ?Pain management: pain level controlled ?Vital Signs Assessment: post-procedure vital signs reviewed and stable ?Respiratory status: spontaneous breathing, nonlabored ventilation, respiratory function stable and patient connected to nasal cannula oxygen ?Cardiovascular status: blood pressure returned to baseline and stable ?Postop Assessment: no apparent nausea or vomiting ?Anesthetic complications: no ? ? ?No notable events documented. ? ?Last Vitals:  ?Vitals:  ? 10/03/21 1000 10/03/21 1049  ?BP: 116/76 125/74  ?Pulse: (!) 46 (!) 49  ?Resp: 14 16  ?Temp: 36.5 ?C 36.8 ?C  ?SpO2: 100% 100%  ?  ?Last Pain:  ?Vitals:  ? 10/03/21 1024  ?TempSrc:   ?PainSc: 6   ? ? ?  ?  ?  ?  ?  ?  ? ?Chon Buhl L Sangita Zani ? ? ? ? ?

## 2021-10-03 NOTE — Progress Notes (Signed)
PHARMACY CONSULT FOR:  Risk Assessment for Post-Discharge VTE Following Bariatric Surgery ? ?Post-Discharge VTE Risk Assessment: ?This patient's probability of 30-day post-discharge VTE is increased due to the factors marked: ?X Sleeve gastrectomy  ? Liver disorder (transplant, cirrhosis, or nonalcoholic steatohepatitis)  ? Hx of VTE  ? Hemorrhage requiring transfusion  ? GI perforation, leak, or obstruction  ? ====================================================  ?  Female  ?  Age >/=60 years  ?  BMI >/=50 kg/m2  ?  CHF  ?  Dyspnea at Rest  ?  Paraplegia  ?X  Non-gastric-band surgery  ?  Operation Time >/=3 hr  ?  Return to OR   ?  Length of Stay >/= 3 d  ? Hypercoagulable condition  ? Significant venous stasis  ? ? ? ? ?Predicted probability of 30-day post-discharge VTE: 0.16% ? ?Other patient-specific factors to consider: no ? ?Recommendation for Discharge: ?No pharmacologic prophylaxis post-discharge ? ?Dana Frost is a 41 y.o. female who underwent laparoscopic sleeve gastrectomy 10/03/2021 ? ? ?Allergies  ?Allergen Reactions  ? Penicillins Hives  ? ? ?Patient Measurements: ?Height: 5\' 5"  (165.1 cm) ?Weight: 113.7 kg (250 lb 9.6 oz) ?IBW/kg (Calculated) : 57 ?Body mass index is 41.7 kg/m?. ? ?No results for input(s): WBC, HGB, HCT, PLT, APTT, CREATININE, LABCREA, CREATININE, CREAT24HRUR, MG, PHOS, ALBUMIN, PROT, ALBUMIN, AST, ALT, ALKPHOS, BILITOT, BILIDIR, IBILI in the last 72 hours. ?Estimated Creatinine Clearance: 117.6 mL/min (by C-G formula based on SCr of 0.75 mg/dL). ? ? ? ?Past Medical History:  ?Diagnosis Date  ? Anemia   ? Anxiety   ? Asthma   ? Depression   ? Fibromyalgia   ? Gallstone   ? GERD (gastroesophageal reflux disease)   ? H/O hearing loss   ? 90%left, 10%right  ? Headache(784.0)   ? Heart murmur   ? as a child , outgrown per pt per MD  ? History of panic attacks   ? after death of mother  ? Idiopathic intracranial hypertension   ? Migraine   ? Papilledema   ? Pneumonia   ? ? ? ?Medications  Prior to Admission  ?Medication Sig Dispense Refill Last Dose  ? Cholecalciferol (VITAMIN D3 PO) Take 1 tablet by mouth daily.   10/02/2021  ? lamoTRIgine (LAMICTAL) 100 MG tablet Take 100 mg by mouth daily.   10/03/2021 at 0400  ? Multiple Vitamins-Minerals (MULTI ADULT GUMMIES PO) Take 1 each by mouth daily.   Past Week  ? OVER THE COUNTER MEDICATION Take 1 tablet by mouth daily. [employer] - Energy Supplement   09/26/2021  ? OVER THE COUNTER MEDICATION Take 1-2 each by mouth daily as needed (pain). Delta 8 Gummies 10 mg each   09/26/2021  ? diclofenac Sodium (VOLTAREN) 1 % GEL Apply 2 g topically 4 (four) times daily as needed (knee pain).   More than a month  ? escitalopram (LEXAPRO) 10 MG tablet Take 1 tablet (10 mg total) by mouth daily. (Patient not taking: Reported on 09/12/2021) 30 tablet 0 Not Taking  ? lidocaine (LMX) 4 % cream Apply 1 application topically as needed (back pain).   More than a month  ? rizatriptan (MAXALT-MLT) 10 MG disintegrating tablet Take 1 tablet (10 mg total) by mouth as needed for migraine. May repeat in 2 hours if needed (Patient not taking: Reported on 03/19/2019) 12 tablet 11   ? ? ?03/21/2019, Pharm.D ?10/03/2021 9:30 AM ? ?

## 2021-10-03 NOTE — Anesthesia Procedure Notes (Signed)
Procedure Name: Intubation ?Date/Time: 10/03/2021 7:41 AM ?Performed by: Eben Burow, CRNA ?Pre-anesthesia Checklist: Patient identified, Emergency Drugs available, Suction available, Patient being monitored and Timeout performed ?Patient Re-evaluated:Patient Re-evaluated prior to induction ?Oxygen Delivery Method: Circle system utilized ?Preoxygenation: Pre-oxygenation with 100% oxygen ?Induction Type: IV induction ?Ventilation: Mask ventilation without difficulty ?Laryngoscope Size: Mac and 4 ?Grade View: Grade I ?Tube type: Oral ?Tube size: 7.0 mm ?Number of attempts: 1 ?Airway Equipment and Method: Stylet ?Placement Confirmation: ETT inserted through vocal cords under direct vision, positive ETCO2 and breath sounds checked- equal and bilateral ?Secured at: 21 cm ?Tube secured with: Tape ?Dental Injury: Teeth and Oropharynx as per pre-operative assessment  ? ? ? ? ?

## 2021-10-03 NOTE — Op Note (Signed)
10/03/2021 ?Liviah Anselmo ?07-Nov-1980 ?[id number] ? ? PRE-OPERATIVE DIAGNOSIS:   ?Severe obesity (BMI 42) ? ?Bipolar depression (CMS-HCC) ? ?Chronic pain of both feet ? ?Vitamin D deficiency ? ?POST-OPERATIVE DIAGNOSIS:  same ? ?PROCEDURE:  Procedure(s): ?LAPAROSCOPIC SLEEVE GASTRECTOMY ?UPPER GI ENDOSCOPY ? ?SURGEON:  Surgeon(s): ?Atilano Ina, MD FACS FASMBS ? ?ASSISTANTS: Phylliss Blakes MD FACS ? ?ANESTHESIA:   general ? ?DRAINS: none  ? ?BOUGIE: 40 fr ViSiGi ? ?LOCAL MEDICATIONS USED:   Exparel ? ?EBL: minimal ? ?SPECIMEN:  Source of Specimen:  Greater curvature of stomach ? ?DISPOSITION OF SPECIMEN:  PATHOLOGY ? ?COUNTS:  YES ? ?INDICATION FOR PROCEDURE: This is a very pleasant 41 y.o.-year-old morbidly obese female who has had unsuccessful attempts for sustained weight loss. The patient presents today for a planned laparoscopic sleeve gastrectomy with upper endoscopy. We have discussed the risk and benefits of the procedure extensively preoperatively. Please see my separate notes. ? ?PROCEDURE: After obtaining informed consent and receiving 5000 units of subcutaneous heparin, the patient was brought to the operating room at [facility] and placed supine on the operating room table. General endotracheal anesthesia was established. Sequential compression devices were placed. A orogastric tube was placed. The patient's abdomen was prepped and draped in the usual standard surgical fashion. The patient received preoperative IV antibiotic. A surgical timeout was performed. ERAS protocol used.  ? ?Access to the abdomen was achieved using a 5 mm 0? laparoscope thru a 5 mm trocar In the left upper Quadrant 2 fingerbreadths below the left subcostal margin using the Optiview technique. Pneumoperitoneum was smoothly established up to 15 mm of mercury. The laparoscope was advanced and the abdominal cavity was surveilled. The patient was then placed in reverse Trendelenburg.   A 5 mm trocar was placed slightly above  and to the left of the umbilicus under direct visualization.  The [facility] liver retractor was placed under the left lobe of the liver through a 5 mm trocar incision site in the subxiphoid position. A 5 mm trocar was placed in the lateral right upper quadrant along with a 15 mm trocar in the mid right abdomen. A final 5 mm trocar was placed in the lateral LUQ.  All under direct visualization after exparel had been infiltrated in bilateral lateral upper abdominal walls as a TAP block for postoperative pain relief. ? ?The stomach was inspected. It was completely decompressed and the orogastric tube was removed. ? ?There was no anterior dimple that was obviously visible. Her preop UGI showed no hiatal hernia.  ? ? ?We identified the pylorus and measured 6 cm proximal to the pylorus and identified an area of where we would start taking down the short gastric vessels. Harmonic scalpel was used to take down the short gastric vessels along the greater curvature of the stomach. We were able to enter the lesser sac. We continued to march along the greater curvature of the stomach taking down the short gastrics. As we approached the gastrosplenic ligament we took care in this area not to injure the spleen. We were able to take down the entire gastrosplenic ligament. We then mobilized the fundus away from the left crus of diaphragm. There were not any significant posterior gastric avascular attachments. This left the stomach completely mobilized. No vessels had been taken down along the lesser curvature of the stomach. ? ?We then reidentified the pylorus. A 40Fr ViSiGi was then placed in the oropharynx and advanced down into the stomach and placed in the distal antrum and positioned  along the lesser curvature. It was placed under suction which secured the 40Fr ViSiGi in place along the lesser curve. Then using the Ethicon echelon 60 mm stapler with a green load with ethicon staple line reinforcement (ESLR), I placed a stapler  along the antrum approximately 5 cm from the pylorus. The stapler was angled so that there is ample room at the angularis incisura. I then fired the first staple load after inspecting it posteriorly to ensure adequate space both anteriorly and posteriorly.  At this point I started using 60 mm blue load staple cartridges with ESLR. The echelon stapler was then repositioned with a 60 mm blue load with ESLR and we continued to march up along the ViSiGi. My assistant was holding traction along the greater curvature stomach along the cauterized short gastric vessels ensuring that the stomach was symmetrically retracted. Prior to each firing of the staple, we rotated the stomach to ensure that there is adequate stomach left.  As we approached the fundus, I used 60 mm blue cartridge with ESLR aiming  lateral to the GE junction after mobilizing some of the esophageal fat pad.  The sleeve was inspected. There is no evidence of cork screw. The staple line appeared hemostatic. The CRNA inflated the ViSiGi to the green zone and the upper abdomen was flooded with saline. There were no bubbles. The sleeve was decompressed and the ViSiGi removed. My assistant scrubbed out and performed an upper endoscopy. The sleeve easily distended with air and the scope was easily advanced to the pylorus. There is no evidence of internal bleeding or cork screwing. There was no narrowing at the angularis. There is no evidence of bubbles. Please see her operative note for further details. The gastric sleeve was decompressed and the endoscope was removed.  The greater curvature the stomach was grasped with a laparoscopic grasper and removed from the 15 mm trocar site.  The liver retractor was removed. I then closed the 15 mm trocar site with 1 interrupted 0 Vicryl sutures through the fascia using the endoclose. The closure was viewed laparoscopically and it was airtight. Remaining Exparel was then infiltrated in the preperitoneal spaces around the  trocar sites. Pneumoperitoneum was released. All trocar sites were closed with a 4-0 Monocryl in a subcuticular fashion followed by the application of steri-strips, and bandaids. The patient was extubated and taken to the recovery room in stable condition. All needle, instrument, and sponge counts were correct x2. There are no immediate complications ? ?(1) 60 mm green with ESLR ?(0) 60 mm gold with ESLR ?(5) 60 mm blue with ESLR ? ?PLAN OF CARE: Admit to inpatient  ? ?PATIENT DISPOSITION:  PACU - hemodynamically stable. ?  ?Delay start of Pharmacological VTE agent (>24hrs) due to surgical blood loss or risk of bleeding:  no ? ?Mary Sella. Andrey Campanile, MD, FACS FASMBS ?General, Bariatric, & Minimally Invasive Surgery ?[facility], [place] ? ?

## 2021-10-04 ENCOUNTER — Encounter (HOSPITAL_COMMUNITY): Payer: Self-pay | Admitting: General Surgery

## 2021-10-04 ENCOUNTER — Other Ambulatory Visit (HOSPITAL_COMMUNITY): Payer: Self-pay

## 2021-10-04 LAB — COMPREHENSIVE METABOLIC PANEL
ALT: 24 U/L (ref 0–44)
AST: 16 U/L (ref 15–41)
Albumin: 3.6 g/dL (ref 3.5–5.0)
Alkaline Phosphatase: 46 U/L (ref 38–126)
Anion gap: 8 (ref 5–15)
BUN: 7 mg/dL (ref 6–20)
CO2: 21 mmol/L — ABNORMAL LOW (ref 22–32)
Calcium: 8.9 mg/dL (ref 8.9–10.3)
Chloride: 105 mmol/L (ref 98–111)
Creatinine, Ser: 0.73 mg/dL (ref 0.44–1.00)
GFR, Estimated: >60 mL/min (ref >60)
Glucose, Bld: 128 mg/dL — ABNORMAL HIGH (ref 70–99)
Potassium: 4.3 mmol/L (ref 3.5–5.1)
Sodium: 134 mmol/L — ABNORMAL LOW (ref 135–145)
Total Bilirubin: 0.2 mg/dL — ABNORMAL LOW (ref 0.3–1.2)
Total Protein: 6.7 g/dL (ref 6.5–8.1)

## 2021-10-04 LAB — CBC WITH DIFFERENTIAL/PLATELET
Abs Immature Granulocytes: 0.01 10*3/uL (ref 0.00–0.07)
Basophils Absolute: 0 10*3/uL (ref 0.0–0.1)
Basophils Relative: 0 %
Eosinophils Absolute: 0 10*3/uL (ref 0.0–0.5)
Eosinophils Relative: 0 %
HCT: 36.2 % (ref 36.0–46.0)
Hemoglobin: 12.3 g/dL (ref 12.0–15.0)
Immature Granulocytes: 0 %
Lymphocytes Relative: 18 %
Lymphs Abs: 1.7 10*3/uL (ref 0.7–4.0)
MCH: 29.8 pg (ref 26.0–34.0)
MCHC: 34 g/dL (ref 30.0–36.0)
MCV: 87.7 fL (ref 80.0–100.0)
Monocytes Absolute: 0.7 10*3/uL (ref 0.1–1.0)
Monocytes Relative: 7 %
Neutro Abs: 7.2 10*3/uL (ref 1.7–7.7)
Neutrophils Relative %: 75 %
Platelets: 206 10*3/uL (ref 150–400)
RBC: 4.13 MIL/uL (ref 3.87–5.11)
RDW: 12.7 % (ref 11.5–15.5)
WBC: 9.6 10*3/uL (ref 4.0–10.5)
nRBC: 0 % (ref 0.0–0.2)

## 2021-10-04 LAB — SURGICAL PATHOLOGY

## 2021-10-04 MED ORDER — ACETAMINOPHEN 500 MG PO TABS: 1000.0000 mg | ORAL_TABLET | Freq: Three times a day (TID) | ORAL | 0 refills | Status: AC

## 2021-10-04 MED ORDER — PANTOPRAZOLE SODIUM 40 MG PO TBEC
40.0000 mg | DELAYED_RELEASE_TABLET | Freq: Every day | ORAL | 0 refills | Status: DC
  Filled 2021-10-04: qty 90, 90d supply, fill #0

## 2021-10-04 MED ORDER — TRAMADOL HCL 50 MG PO TABS
50.0000 mg | ORAL_TABLET | Freq: Four times a day (QID) | ORAL | 0 refills | Status: DC | PRN
  Filled 2021-10-04: qty 10, 3d supply, fill #0

## 2021-10-04 MED ORDER — ONDANSETRON 4 MG PO TBDP
4.0000 mg | ORAL_TABLET | Freq: Four times a day (QID) | ORAL | 0 refills | Status: DC | PRN
  Filled 2021-10-04: qty 18, 21d supply, fill #0

## 2021-10-04 NOTE — Progress Notes (Signed)
Pt continues to experience nausea and @ 1400 experienced a episode of vomiting. IV Zofran given @1400  to help provide some relief and help with intake.  ?

## 2021-10-04 NOTE — Progress Notes (Signed)
Pt is ambulating in hallway and states a decrease in nausea. Increase in clear fluid intake. Gas discomfort has decreased over the course of  today via ambulation.  ?

## 2021-10-04 NOTE — Progress Notes (Signed)
1 Day Post-Op  ? ?Subjective/Chief Complaint: ?Initially did well but started having nausea overnight/this am ?Walked some ?Met water intake ?Struggling with protein this am ? ? ?Objective: ?Vital signs in last 24 hours: ?Temp:  [97.6 ?F (36.4 ?C)-99.3 ?F (37.4 ?C)] 98.4 ?F (36.9 ?C) (03/08 8588) ?Pulse Rate:  [52-57] 53 (03/08 0839) ?Resp:  [16-18] 16 (03/08 0839) ?BP: (112-149)/(73-92) 132/91 (03/08 0839) ?SpO2:  [97 %-100 %] 97 % (03/08 0839) ?Last BM Date : 10/02/21 ? ?Intake/Output from previous day: ?03/07 0701 - 03/08 0700 ?In: 3975 [P.O.:600; I.V.:3272; IV Piggyback:103] ?Out: 1700 [Urine:1650; Blood:50] ?Intake/Output this shift: ?Total I/O ?In: 539 [P.O.:60; I.V.:479] ?Out: 950 [Urine:950] ? ?Alert, nontoxic ?Symm chest rise ?Nonlabored ?No tachy ?Soft, mild expected ttp, incisions ok ?No edema ? ?Lab Results:  ?Recent Labs  ?  10/03/21 ?5027 10/04/21 ?0420  ?WBC  --  9.6  ?HGB 12.2 12.3  ?HCT 37.2 36.2  ?PLT  --  206  ? ?BMET ?Recent Labs  ?  10/04/21 ?0420  ?NA 134*  ?K 4.3  ?CL 105  ?CO2 21*  ?GLUCOSE 128*  ?BUN 7  ?CREATININE 0.73  ?CALCIUM 8.9  ? ?PT/INR ?No results for input(s): LABPROT, INR in the last 72 hours. ?ABG ?No results for input(s): PHART, HCO3 in the last 72 hours. ? ?Invalid input(s): PCO2, PO2 ? ?Studies/Results: ?No results found. ? ?Anti-infectives: ?Anti-infectives (From admission, onward)  ? ? Start     Dose/Rate Route Frequency Ordered Stop  ? 10/03/21 0630  gentamicin (GARAMYCIN) 120 mg, clindamycin (CLEOCIN) 900 mg in dextrose 5 % 100 mL IVPB  Status:  Discontinued       ? 120 mg ?218 mL/hr over 30 Minutes Intravenous On call to O.R. 10/03/21 0533 10/03/21 0549  ? 10/03/21 0630  gentamicin (GARAMYCIN) IVPB 120 mg  Status:  Discontinued       ? 120 mg ?200 mL/hr over 30 Minutes Intravenous On call to O.R. 10/03/21 0549 10/03/21 0556  ? 10/03/21 0630  gentamicin (GARAMYCIN) 120 mg in dextrose 5 % 50 mL IVPB       ? 120 mg ?106 mL/hr over 30 Minutes Intravenous On call to O.R.  10/03/21 0556 10/03/21 0818  ? 10/03/21 0600  clindamycin (CLEOCIN) IVPB 900 mg       ? 900 mg ?100 mL/hr over 30 Minutes Intravenous On call to O.R. 10/03/21 0549 10/03/21 0805  ? ?  ? ? ?Assessment/Plan: ?s/p Procedure(s): ?LAPAROSCOPIC GASTRIC SLEEVE RESECTION (N/A) ?UPPER GI ENDOSCOPY (N/A) ? ?Cont bari diet as tolerated ?Antinausea meds as needed ?Cont chemical vte prophylaxis ?Scds ?Not sure if pt will meet dc goals today due to inadequate oral intake ?Will try chicken soup shake ? ? LOS: 1 day  ? ? ?Dana Frost ?10/04/2021 ? ?

## 2021-10-04 NOTE — Plan of Care (Signed)
  Problem: Education: Goal: Knowledge of General Education information will improve Description: Including pain rating scale, medication(s)/side effects and non-pharmacologic comfort measures Outcome: Adequate for Discharge   Problem: Health Behavior/Discharge Planning: Goal: Ability to manage health-related needs will improve Outcome: Adequate for Discharge   Problem: Clinical Measurements: Goal: Ability to maintain clinical measurements within normal limits will improve Outcome: Adequate for Discharge Goal: Will remain free from infection Outcome: Adequate for Discharge Goal: Diagnostic test results will improve Outcome: Adequate for Discharge Goal: Respiratory complications will improve Outcome: Adequate for Discharge Goal: Cardiovascular complication will be avoided Outcome: Adequate for Discharge   Problem: Activity: Goal: Risk for activity intolerance will decrease Outcome: Adequate for Discharge   Problem: Nutrition: Goal: Adequate nutrition will be maintained Outcome: Adequate for Discharge   Problem: Coping: Goal: Level of anxiety will decrease Outcome: Adequate for Discharge   Problem: Elimination: Goal: Will not experience complications related to bowel motility Outcome: Adequate for Discharge Goal: Will not experience complications related to urinary retention Outcome: Adequate for Discharge   Problem: Pain Managment: Goal: General experience of comfort will improve Outcome: Adequate for Discharge   Problem: Safety: Goal: Ability to remain free from injury will improve Outcome: Adequate for Discharge   Problem: Skin Integrity: Goal: Risk for impaired skin integrity will decrease Outcome: Adequate for Discharge   Problem: Education: Goal: Ability to state signs and symptoms to report to health care provider will improve Outcome: Adequate for Discharge Goal: Knowledge of the prescribed self-care regimen will improve Outcome: Adequate for  Discharge Goal: Knowledge of discharge needs will improve Outcome: Adequate for Discharge   Problem: Activity: Goal: Ability to tolerate increased activity will improve Outcome: Adequate for Discharge   Problem: Bowel/Gastric: Goal: Gastrointestinal status for postoperative course will improve Outcome: Adequate for Discharge Goal: Occurrences of nausea will decrease Outcome: Adequate for Discharge   Problem: Coping: Goal: Development of coping mechanisms to deal with changes in body function or appearance will improve Outcome: Adequate for Discharge   Problem: Fluid Volume: Goal: Maintenance of adequate hydration will improve Outcome: Adequate for Discharge   Problem: Nutritional: Goal: Nutritional status will improve Outcome: Adequate for Discharge   Problem: Clinical Measurements: Goal: Will show no signs or symptoms of venous thromboembolism Outcome: Adequate for Discharge Goal: Will remain free from infection Outcome: Adequate for Discharge Goal: Will show no signs of GI Leak Outcome: Adequate for Discharge   Problem: Respiratory: Goal: Will regain and/or maintain adequate ventilation Outcome: Adequate for Discharge   Problem: Pain Management: Goal: Pain level will decrease Outcome: Adequate for Discharge   Problem: Skin Integrity: Goal: Demonstration of wound healing without infection will improve Outcome: Adequate for Discharge   

## 2021-10-04 NOTE — Progress Notes (Signed)
Patient alert and oriented, Post op day 1.  Provided support and encouragement.  Encouraged pulmonary toilet, ambulation and small sips of liquids due to nausea.  All questions answered.  Will continue to monitor.  ?

## 2021-10-04 NOTE — Progress Notes (Signed)
Patient alert and oriented, pain is controlled. Patient has advanced to protein shake today, patient progressing slow, c/o nausea.  Medications given.  MD aware.  Reviewed Gastric sleeve discharge instructions with patient and spouse.  Both are able to articulate understanding.  Provided information on BELT program, Support Group and [facility] outpatient pharmacy. All questions answered, will continue to monitor.  ?

## 2021-10-04 NOTE — Progress Notes (Signed)
Pt states increase nausea and dry heaving. Zofran given @ 0600 by night shift with no relief. Day shift gave pt compazine @ 0900 with more relief. We will continue to monitor pt    ?

## 2021-10-04 NOTE — Progress Notes (Signed)
Transition of Care (TOC) Screening Note ? ?Patient Details  ?Name: Dana Frost ?Date of Birth: [birth year] ? ?Transition of Care (TOC) CM/SW Contact:    ?Ewing Schlein, LCSW ?Phone Number: ?10/04/2021, 11:14 AM ? ?Transition of Care Department [facility]) has reviewed patient and no TOC needs have been identified at this time. We will continue to monitor patient advancement through interdisciplinary progression rounds. If new patient transition needs arise, please place a TOC consult. ?

## 2021-10-06 ENCOUNTER — Telehealth (HOSPITAL_COMMUNITY): Payer: Self-pay | Admitting: *Deleted

## 2021-10-06 NOTE — Telephone Encounter (Addendum)
1.  Tell me about your pain and pain management? ?Pt denies any pain, just sore. ? ?2.  Let's talk about fluid intake.  How much total fluid are you taking in? ?Pt states that she is getting in at least 64oz of fluid including protein shakes, bottled water, and broth. Pt instructed to assess status and suggestions daily utilizing Hydration Action Plan on discharge folder and to call [facility] if in the "red zone".  ? ?3.  How much protein have you taken in the last 2 days? ?Pt states she is meeting her goal of 60g of protein each day with the protein shakes. ? ?4.  Have you had nausea?  Tell me about when have experienced nausea and what you did to help? ?Pt denies current nausea. Pt states that she has some nausea in the morning when she first gets up, but usually subsides.  Pt states that she has only had to take one Zofran since discharge. ?  ?5.  Has the frequency or color changed with your urine? ?Pt states that she is urinating "fine" with no changes in frequency or urgency.   ?  ?6.  Tell me what your incisions look like? ?"Incisions look fine". Pt denies a fever, chills.  Pt states incisions are not swollen, open, or draining.  Pt encouraged to call [facility] if incisions change. ?  ?7.  Have you been passing gas? BM? ?Pt states that she is having BMs. Last BM 10/06/21.   ?  ?8.  If a problem or question were to arise who would you call?  Do you know contact numbers for [facility], [facility], and [facility]? ?Pt denies dehydration symptoms.  Pt can describe s/sx of dehydration.  Pt knows to call [facility] for surgical, [facility] for nutrition, and [facility] for non-urgent questions or concerns. ?  ?9.  How has the walking going? ?Pt states she is walking around and able to be active without difficulty. ?  ?10. Are you still using your incentive spirometer?  If so, how often? ?Pt does not have I.S. Discussed with pt the TCBD techniques. Pt encouraged to TCDB, at least 10x every hour while awake until she sees the surgeon. ? ?11.  How are your vitamins and  calcium going?  How are you taking them? ?Pt states that she is taking her supplements and vitamins without difficulty. ? ?Reminded patient that the first 30 days post-operatively are important for successful recovery.  Practice good hand hygiene, wearing a mask when appropriate (since optional in most places), and minimizing exposure to people who live outside of the home, especially if they are exhibiting any respiratory, GI, or illness-like symptoms.   ? ?

## 2021-10-06 NOTE — Discharge Summary (Signed)
Physician Discharge Summary  ?Dana Frost ZOX:[id number] DOB: 1980/08/21 DOA: 10/03/2021 ? ?PCP: Elisabeth Cara, PA-C ? ?Admit date: 10/03/2021 ?Discharge date: 10/04/2021 ? ?Recommendations for Outpatient Follow-up:  ? ? Follow-up Information   ? ? Carlena Hurl, PA-C. Go on 10/27/2021.   ?Specialty: General Surgery ?Why: at 9:45am for Dr. Redmond Pulling.  Please arrive 15 minutes prior to your appointment time.  Thank you. ?Contact information: ?[address] ?[address] ?[place] [address] ?[phone] ? ? ?  ?  ? ? Greer Pickerel, MD. Daphane Shepherd on 11/23/2021.   ?Specialty: General Surgery ?Why: at 3:45pm.  Please arrive 15 minutes prior to your appointment of time.  Thank you. ?Contact information: ?[address] ?[address] ?[place] [address] ?[phone] ? ? ?  ?  ? ?  ?  ? ?  ? ?Discharge Diagnoses:  ?Principal Problem: ?  S/P laparoscopic sleeve gastrectomy ?Severe obesity (BMI 42) ? ?Bipolar depression (CMS-HCC) ? ?Chronic pain of both feet ? ?Vitamin D deficiency ? ?Surgical Procedure: Laparoscopic Sleeve Gastrectomy, upper endoscopy ? ?Discharge Condition: Good ?Disposition: Home ? ?Diet recommendation: Postoperative sleeve gastrectomy diet (liquids only) ? ?Filed Weights  ? 10/03/21 0635  ?Weight: 113.7 kg  ? ? ? ?Hospital Course:  ?The patient was admitted for a planned laparoscopic sleeve gastrectomy. Please see operative note. Preoperatively the patient was given 5000 units of subcutaneous heparin for DVT prophylaxis. Postoperative prophylactic Lovenox dosing was started on the evening of postoperative day 0. ERAS protocol was used. On the evening of postoperative day 0, the patient was started on water and ice chips. On postoperative day 1 the patient had no fever or tachycardia and was tolerating water in their diet was gradually advanced throughout the day. The patient was ambulating without difficulty. Their vital signs are stable without fever or tachycardia. Their hemoglobin had remained stable. . The patient  had received discharge instructions and counseling. They were deemed stable for discharge and had met discharge criteria ? ?Her oral intake thruout the day and she met discharge criteria ? ?Discharge Instructions ? ?Discharge Instructions   ? ? Ambulate hourly while awake   Complete by: As directed ?  ? Call MD for:  difficulty breathing, headache or visual disturbances   Complete by: As directed ?  ? Call MD for:  persistant dizziness or light-headedness   Complete by: As directed ?  ? Call MD for:  persistant nausea and vomiting   Complete by: As directed ?  ? Call MD for:  redness, tenderness, or signs of infection (pain, swelling, redness, odor or green/yellow discharge around incision site)   Complete by: As directed ?  ? Call MD for:  severe uncontrolled pain   Complete by: As directed ?  ? Call MD for:  temperature >101 F   Complete by: As directed ?  ? Diet bariatric full liquid   Complete by: As directed ?  ? Discharge instructions   Complete by: As directed ?  ? See bariatric discharge instructions  ? Incentive spirometry   Complete by: As directed ?  ? Perform hourly while awake  ? ?  ? ?Allergies as of 10/04/2021   ? ?   Reactions  ? Penicillins Hives  ? ?  ? ?  ?Medication List  ?  ? ?STOP taking these medications   ? ?escitalopram 10 MG tablet ?Commonly known as: LEXAPRO ?  ?OVER THE COUNTER MEDICATION ?  ?OVER THE COUNTER MEDICATION ?  ?rizatriptan 10 MG disintegrating tablet ?Commonly  known as: Maxalt-MLT ?  ? ?  ? ?TAKE these medications   ? ?acetaminophen 500 MG tablet ?Commonly known as: TYLENOL ?Take 2 tablets (1,000 mg total) by mouth every 8 (eight) hours for 5 days. ?  ?diclofenac Sodium 1 % Gel ?Commonly known as: VOLTAREN ?Apply 2 g topically 4 (four) times daily as needed (knee pain). ?Notes to patient: Avoid NSAIDs for 6-8 weeks after surgery ?  ?lamoTRIgine 100 MG tablet ?Commonly known as: LAMICTAL ?Take 100 mg by mouth daily. ?  ?lidocaine 4 % cream ?Commonly known as: LMX ?Apply 1  application topically as needed (back pain). ?  ?MULTI ADULT GUMMIES PO ?Take 1 each by mouth daily. ?  ?ondansetron 4 MG disintegrating tablet ?Commonly known as: ZOFRAN-ODT ?Dissolve 1 tablet (4 mg total) by mouth every 6 (six) hours as needed for nausea or vomiting. ?  ?pantoprazole 40 MG tablet ?Commonly known as: PROTONIX ?Take 1 tablet (40 mg total) by mouth daily. ?  ?traMADol 50 MG tablet ?Commonly known as: ULTRAM ?Take 1 tablet (50 mg total) by mouth every 6 (six) hours as needed (pain). ?  ?VITAMIN D3 PO ?Take 1 tablet by mouth daily. ?  ? ?  ? ? Follow-up Information   ? ? Carlena Hurl, PA-C. Go on 10/27/2021.   ?Specialty: General Surgery ?Why: at 9:45am for Dr. Redmond Pulling.  Please arrive 15 minutes prior to your appointment time.  Thank you. ?Contact information: ?[address] ?[address] ?[place] [address] ?[phone] ? ? ?  ?  ? ? Greer Pickerel, MD. Daphane Shepherd on 11/23/2021.   ?Specialty: General Surgery ?Why: at 3:45pm.  Please arrive 15 minutes prior to your appointment of time.  Thank you. ?Contact information: ?[address] ?[address] ?[place] [address] ?[phone] ? ? ?  ?  ? ?  ?  ? ?  ? ? ? ?The results of significant diagnostics from this hospitalization (including imaging, microbiology, ancillary and laboratory) are listed below for reference.   ? ?Significant Diagnostic Studies: ?No results found. ? ?Labs: ?Basic Metabolic Panel: ?Recent Labs  ?Lab 10/04/21 ?0420  ?NA 134*  ?K 4.3  ?CL 105  ?CO2 21*  ?GLUCOSE 128*  ?BUN 7  ?CREATININE 0.73  ?CALCIUM 8.9  ? ?Liver Function Tests: ?Recent Labs  ?Lab 10/04/21 ?0420  ?AST 16  ?ALT 24  ?ALKPHOS 46  ?BILITOT 0.2*  ?PROT 6.7  ?ALBUMIN 3.6  ? ? ?CBC: ?Recent Labs  ?Lab 10/03/21 ?0931 10/04/21 ?0420  ?WBC  --  9.6  ?NEUTROABS  --  7.2  ?HGB 12.2 12.3  ?HCT 37.2 36.2  ?MCV  --  87.7  ?PLT  --  206  ? ? ?CBG: ?No results for input(s): GLUCAP in the last 168 hours. ? ?Principal Problem: ?  S/P laparoscopic sleeve gastrectomy ? ? ?Time coordinating  discharge: 15 min ? ?Signed: ? ?Gayland Curry, MD FACS ?[facility], [place] ?[phone] ?10/06/2021, 8:30 AM ? ?

## 2021-10-17 ENCOUNTER — Other Ambulatory Visit: Payer: Self-pay

## 2021-10-17 ENCOUNTER — Encounter: Payer: BC Managed Care – PPO | Attending: General Surgery | Admitting: Skilled Nursing Facility1

## 2021-10-17 DIAGNOSIS — E669 Obesity, unspecified: Secondary | ICD-10-CM | POA: Diagnosis present

## 2021-10-18 NOTE — Progress Notes (Signed)
2 Week Post-Operative Nutrition Class ?  ?Patient was seen on 10/17/2021 for Post-Operative Nutrition education at [facility].  ?  ?Anthropometrics  ?Start weight at [facility]: 269.6 lbs (date: 07/31/2021)  ?Weight: 236.3 pounds ?  ?Clinical  ?Medical hx: bipolar depression, anxiety ?Medications: see list  ?Labs: vitamin D 23.9 ?Notable signs/symptoms: back pain ?Any previous deficiencies? Yes: vitamin D ?  ?Body Composition Scale 10/17/2021  ?Current Body Weight 236.6  ?Total Body Fat % 42.2  ?Visceral Fat 12  ?Fat-Free Mass % 57.7  ? Total Body Water % 43.3  ?Muscle-Mass lbs 33.6  ?BMI 38.1  ?Body Fat Displacement   ?       Torso  lbs 61.9  ?       Left Leg  lbs 12.3  ?       Right Leg  lbs 12.3  ?       Left Arm  lbs 6.1  ?       Right Arm   lbs 6.1  ? ? ?  ?The following the learning objectives were met by the patient during this course: ?Identifies Phase 3 (Soft, High Proteins) Dietary Goals and will begin from 2 weeks post-operatively to 2 months post-operatively ?Identifies appropriate sources of fluids and proteins  ?Identifies appropriate fat sources and healthy verses unhealthy fat types   ?States protein recommendations and appropriate sources post-operatively ?Identifies the need for appropriate texture modifications, mastication, and bite sizes when consuming solids ?Identifies appropriate fat consumption and sources ?Identifies appropriate multivitamin and calcium sources post-operatively ?Describes the need for physical activity post-operatively and will follow MD recommendations ?States when to call healthcare provider regarding medication questions or post-operative complications ?  ?Handouts given during class include: ?Phase 3A: Soft, High Protein Diet Handout ?Phase 3 High Protein Meals ?Healthy Fats ?  ?Follow-Up Plan: ?Patient will follow-up at [facility] in 6 weeks for 2 month post-op nutrition visit for diet advancement per MD. ? ?

## 2021-10-23 ENCOUNTER — Telehealth: Payer: Self-pay | Admitting: Skilled Nursing Facility1

## 2021-10-23 NOTE — Telephone Encounter (Signed)
RD called pt to verify fluid intake once starting soft, solid proteins 2 week post-bariatric surgery.  ? ?Daily Fluid intake: 64 oz ?Daily Protein intake: 60 g ?Bowel Habits: every day to every other day ? ?Concerns/issues:  ? ? ?None stated. Using the baritastic app.  ?

## 2021-11-30 ENCOUNTER — Encounter: Payer: Self-pay | Admitting: Skilled Nursing Facility1

## 2021-11-30 ENCOUNTER — Encounter: Payer: BC Managed Care – PPO | Attending: General Surgery | Admitting: Skilled Nursing Facility1

## 2021-11-30 DIAGNOSIS — E669 Obesity, unspecified: Secondary | ICD-10-CM | POA: Insufficient documentation

## 2021-11-30 NOTE — Progress Notes (Signed)
Bariatric Nutrition Follow-Up Visit ?Medical Nutrition Therapy  ?Appt Start Time: 7:35   End Time: 8:02 ? ?NUTRITION ASSESSMENT ?  ?Anthropometrics  ?Start weight at [facility]: 269.6 lbs (date: 07/31/2021)  ?Weight:  224.0 pounds ?  ?Clinical  ?Medical hx: bipolar depression, anxiety ?Medications: see list  ?Labs: vitamin D 23.9 ?Notable signs/symptoms: back pain ?Any previous deficiencies? Yes: vitamin D ?  ?Body Composition Scale 10/17/2021 11/30/2021  ?Current Body Weight 236.6 224.0  ?Total Body Fat % 42.2 40.7  ?Visceral Fat 12 11  ?Fat-Free Mass % 57.7 59.2  ? Total Body Water % 43.3 44.1  ?Muscle-Mass lbs 33.6 33.5  ?BMI 38.1 36.1  ?Body Fat Displacement    ?       Torso  lbs 61.9 56.5  ?       Left Leg  lbs 12.3 11.3  ?       Right Leg  lbs 12.3 11.3  ?       Left Arm  lbs 6.1 5.6  ?       Right Arm   lbs 6.1 5.6  ? ?  ?Lifestyle & Dietary Hx ? ?Pt states she has had a little constipation, but states she has BM about every day. ?Pt states she can not eat as much chicken as other meats. ?Pt states she feels the hormonal changes.  States she feels a little bit emotional during menstrual cycle.  Pt [facility]  PCP prescribes hormone treatment.  Pt states she has an appointment next month with PCP and will ask about her dosage. ?Pt states she needed a week to recover from hour long Body Pump class at [employer].  Pt states she needs to build up her endurance before she tries again. ? ? ?Estimated daily fluid intake: 64 oz ?Estimated daily protein intake: 60+ g ?Supplements: multivitamin and calcium ?Current average weekly physical activity: 2-3 miles (45 min) walking every day.  A little resistance.  ? ?24-Hr Dietary Recall ?First Meal: egg bites ?Snack: Protein shake  ?Second Meal: leftovers or tuna packets and string cheese ?Snack:  ?Third Meal: Hello Fresh meats up to 3 oz of chicken or fish 4 oz or ground beef 3 oz. ?Snack: lowfat cottage cheese (good culture  brand) ?Beverages: water, sometimes coffee ? ?Post-Op Goals/ Signs/ Symptoms ?Using straws: no ?Drinking while eating: no ?Chewing/swallowing difficulties: no ?Changes in vision: no ?Changes to mood/headaches: no ?Hair loss/changes to skin/nails: no ?Difficulty focusing/concentrating: no ?Sweating: no ?Limb weakness: no ?Dizziness/lightheadedness: no ?Palpitations: no  ?Carbonated/caffeinated beverages: no ?N/V/D/C/Gas: no ?Abdominal pain: no ?Dumping syndrome: no ? ?  ?NUTRITION DIAGNOSIS  ?Overweight/obesity (Nitro-3.3) related to past poor dietary habits and physical inactivity as evidenced by completed bariatric surgery and following dietary guidelines for continued weight loss and healthy nutrition status. ?  ?  ?NUTRITION INTERVENTION ?Nutrition counseling (C-1) and education (E-2) to facilitate bariatric surgery goals, including: ?Diet advancement to the next phase (phase 4) now including non-starchy vegetables ?The importance of consuming adequate calories as well as certain nutrients daily due to the body's need for essential vitamins, minerals, and fats ?The importance of daily physical activity and to reach a goal of at least 150 minutes of moderate to vigorous physical activity weekly (or as directed by their physician) due to benefits such as increased musculature and improved lab values ?The importance of intuitive eating specifically learning hunger-satiety cues and understanding the importance of learning a new body: The importance of mindful eating  to avoid grazing behaviors  ? ?-Continue to aim for a minimum of 64 fluid ounces 7 days a week with at least 30 ounces being plain water ? ?-Eat non-starchy vegetables 2 times a day 7 days a week ? ?-Start out with soft cooked vegetables today and tomorrow; if tolerated begin to eat raw vegetables or cooked including salads ? ?-Eat your 3 ounces of protein first then start in on your non-starchy vegetables; once you understand how much of your meal leads to  satisfaction and not full while still eating 3 ounces of protein and non-starchy vegetables you can eat them in any order  ? ?-Continue to aim for 30 minutes of activity at least 5 times a week ? ?-Do NOT cook with/add to your food: alfredo sauce, cheese sauce, barbeque sauce, ketchup, fat back, butter, bacon grease, grease, Crisco, OR SUGAR ? ?Handouts Provided Include  ?Phase 4 Food Plan ? ?Learning Style & Readiness for Change ?Teaching method utilized: Visual & Auditory  ?Demonstrated degree of understanding via: Teach Back  ?Readiness Level: Ready ?Barriers to learning/adherence to lifestyle change: non identified ? ?RD's Notes for Next Visit ?Assess adherence to pt chosen goals ? ? ?MONITORING & EVALUATION ?Dietary intake, weekly physical activity, body weight ? ?Next Steps ?Patient is to follow-up in August for 6 month post op class. ?

## 2021-12-13 ENCOUNTER — Emergency Department (HOSPITAL_COMMUNITY): Payer: BC Managed Care – PPO

## 2021-12-13 ENCOUNTER — Encounter (HOSPITAL_COMMUNITY): Payer: Self-pay | Admitting: Emergency Medicine

## 2021-12-13 ENCOUNTER — Emergency Department (HOSPITAL_COMMUNITY)
Admission: EM | Admit: 2021-12-13 | Discharge: 2021-12-14 | Disposition: A | Discharge status: Home / Self Care | Payer: BC Managed Care – PPO | Attending: Emergency Medicine | Admitting: Emergency Medicine

## 2021-12-13 ENCOUNTER — Other Ambulatory Visit: Payer: Self-pay

## 2021-12-13 DIAGNOSIS — E041 Nontoxic single thyroid nodule: Secondary | ICD-10-CM | POA: Insufficient documentation

## 2021-12-13 DIAGNOSIS — J45909 Unspecified asthma, uncomplicated: Secondary | ICD-10-CM | POA: Insufficient documentation

## 2021-12-13 DIAGNOSIS — R202 Paresthesia of skin: Secondary | ICD-10-CM | POA: Diagnosis not present

## 2021-12-13 DIAGNOSIS — M542 Cervicalgia: Secondary | ICD-10-CM | POA: Insufficient documentation

## 2021-12-13 DIAGNOSIS — Z87891 Personal history of nicotine dependence: Secondary | ICD-10-CM | POA: Diagnosis not present

## 2021-12-13 DIAGNOSIS — G4486 Cervicogenic headache: Secondary | ICD-10-CM | POA: Insufficient documentation

## 2021-12-13 LAB — CBC WITH DIFFERENTIAL/PLATELET
Abs Immature Granulocytes: 0.02 10*3/uL (ref 0.00–0.07)
Basophils Absolute: 0 10*3/uL (ref 0.0–0.1)
Basophils Relative: 0 %
Eosinophils Absolute: 0.1 10*3/uL (ref 0.0–0.5)
Eosinophils Relative: 1 %
HCT: 43.7 % (ref 36.0–46.0)
Hemoglobin: 14.9 g/dL (ref 12.0–15.0)
Immature Granulocytes: 0 %
Lymphocytes Relative: 25 %
Lymphs Abs: 2.1 10*3/uL (ref 0.7–4.0)
MCH: 30.4 pg (ref 26.0–34.0)
MCHC: 34.1 g/dL (ref 30.0–36.0)
MCV: 89.2 fL (ref 80.0–100.0)
Monocytes Absolute: 0.5 10*3/uL (ref 0.1–1.0)
Monocytes Relative: 5 %
Neutro Abs: 6 10*3/uL (ref 1.7–7.7)
Neutrophils Relative %: 69 %
Platelets: 219 10*3/uL (ref 150–400)
RBC: 4.9 MIL/uL (ref 3.87–5.11)
RDW: 13.2 % (ref 11.5–15.5)
WBC: 8.7 10*3/uL (ref 4.0–10.5)
nRBC: 0 % (ref 0.0–0.2)

## 2021-12-13 LAB — BASIC METABOLIC PANEL
Anion gap: 7 (ref 5–15)
BUN: 15 mg/dL (ref 6–20)
CO2: 24 mmol/L (ref 22–32)
Calcium: 9 mg/dL (ref 8.9–10.3)
Chloride: 106 mmol/L (ref 98–111)
Creatinine, Ser: 0.71 mg/dL (ref 0.44–1.00)
GFR, Estimated: >60 mL/min (ref >60)
Glucose, Bld: 109 mg/dL — ABNORMAL HIGH (ref 70–99)
Potassium: 3.8 mmol/L (ref 3.5–5.1)
Sodium: 137 mmol/L (ref 135–145)

## 2021-12-13 LAB — HCG, QUANTITATIVE, PREGNANCY: hCG, Beta Chain, Quant, S: <1 m[IU]/mL (ref <5)

## 2021-12-13 MED ORDER — SODIUM CHLORIDE 0.9 % IV BOLUS
1000.0000 mL | Freq: Once | INTRAVENOUS | Status: AC
  Administered 2021-12-13: 1000 mL via INTRAVENOUS

## 2021-12-13 MED ORDER — ACETAMINOPHEN 325 MG PO TABS
650.0000 mg | ORAL_TABLET | Freq: Four times a day (QID) | ORAL | 0 refills | Status: DC | PRN
Start: 2021-12-13 — End: 2021-12-13

## 2021-12-13 MED ORDER — METHOCARBAMOL 500 MG PO TABS: 1000.0000 mg | ORAL_TABLET | Freq: Two times a day (BID) | ORAL | 0 refills | Status: AC

## 2021-12-13 MED ORDER — DIPHENHYDRAMINE HCL 50 MG/ML IJ SOLN
25.0000 mg | Freq: Once | INTRAMUSCULAR | Status: AC
  Administered 2021-12-13: 25 mg via INTRAVENOUS
  Filled 2021-12-13: qty 1

## 2021-12-13 MED ORDER — METOCLOPRAMIDE HCL 5 MG/ML IJ SOLN
5.0000 mg | Freq: Once | INTRAMUSCULAR | Status: AC
  Administered 2021-12-13: 5 mg via INTRAVENOUS
  Filled 2021-12-13: qty 2

## 2021-12-13 MED ORDER — LIDOCAINE 5 % EX PTCH: 1.0000 | MEDICATED_PATCH | Freq: Every day | CUTANEOUS | 0 refills | Status: DC | PRN

## 2021-12-13 MED ORDER — DIAZEPAM 5 MG PO TABS
5.0000 mg | ORAL_TABLET | Freq: Once | ORAL | Status: AC
  Administered 2021-12-13: 5 mg via ORAL
  Filled 2021-12-13: qty 1

## 2021-12-13 MED ORDER — IOHEXOL 350 MG/ML SOLN
75.0000 mL | Freq: Once | INTRAVENOUS | Status: AC | PRN
  Administered 2021-12-13: 75 mL via INTRAVENOUS

## 2021-12-13 MED ORDER — ACETAMINOPHEN-CODEINE 300-30 MG PO TABS: 1.0000 | ORAL_TABLET | Freq: Four times a day (QID) | ORAL | 0 refills | Status: DC | PRN

## 2021-12-13 MED ORDER — BUTALBITAL-APAP-CAFFEINE 50-325-40 MG PO TABS: 1.0000 | ORAL_TABLET | Freq: Four times a day (QID) | ORAL | 0 refills | Status: AC | PRN

## 2021-12-13 MED ORDER — MAGNESIUM SULFATE 2 GM/50ML IV SOLN
2.0000 g | Freq: Once | INTRAVENOUS | Status: AC
  Administered 2021-12-13: 2 g via INTRAVENOUS
  Filled 2021-12-13: qty 50

## 2021-12-13 MED ORDER — IBUPROFEN 600 MG PO TABS: 600.0000 mg | ORAL_TABLET | Freq: Four times a day (QID) | ORAL | 0 refills | Status: DC | PRN

## 2021-12-13 MED ORDER — DEXAMETHASONE SODIUM PHOSPHATE 10 MG/ML IJ SOLN
10.0000 mg | Freq: Once | INTRAMUSCULAR | Status: AC
Start: 2021-12-13 — End: 2021-12-13
  Administered 2021-12-13: 10 mg
  Filled 2021-12-13: qty 1

## 2021-12-13 MED ORDER — HYDROMORPHONE HCL 1 MG/ML IJ SOLN
1.0000 mg | Freq: Once | INTRAMUSCULAR | Status: AC
  Administered 2021-12-13: 1 mg via INTRAVENOUS
  Filled 2021-12-13: qty 1

## 2021-12-13 NOTE — ED Triage Notes (Signed)
Patient presents with increasing neck pain and sever headache. Pain began about a week and a half ago, but has worsened since friday. Nothing helps the pain. She has tried OTC medication, heat and ice. Patient has very limited neck movement. All body movement sends shooting pain that radiates to her neck. Patient recalls hitting her head about a week and a half ago on her car.  ?

## 2021-12-13 NOTE — ED Notes (Signed)
Pt transport to CT  

## 2021-12-13 NOTE — Discharge Instructions (Addendum)
Labs reviewed and are stable.  Patient is neurologic exam is nonfocal.  Favor likely cervicogenic headache versus migraine.  Test talk you it was a pleasure caring for you today in the emergency department. ? ?Please return to the emergency department for any worsening or worrisome symptoms. ? ?Do not take more than 4,000 mg of acetaminophen/tylenol in a 24 hour period ? ?Thyroid nodule noted on CT imaging, recommend you follow-up with your PCP for an outpatient ultrasound of the thyroid ?

## 2021-12-13 NOTE — ED Provider Notes (Signed)
?[facility] DEPT ?Provider Note ? ? ?CSN: [id number] ?Arrival date & time: 12/13/21  1034 ? ?  ? ?History ? ?Chief Complaint  ?Patient presents with  ? Neck Pain  ? Torticollis  ? Headache  ? ? ?Dana Frost is a 41 y.o. female. ? ? Patient as above with significant medical history as below, including migraines, fibromyalgia, idiopathic intracranial hypertension who presents to the ED with complaint of head and neck pain.  Patient reports onset of symptoms around 5 days ago.  She began having a headache while at dinnertime.  Headache progressed down the posterior portion of her neck, more so on the right than the left.  Symptoms have been constant and intermittently worsen with neck movement or twisting her head.  Having some tingling to her bilateral hands but no weakness.  No vision changes, no facial numbness or difficulty speaking.  When she swallows there is some discomfort to the right posterior portion of her neck.  She does report that she hit her head on the side of her car around a week ago.  No LOC.  No recent neck nebulizations.  No falls.  No low back pain.  No fevers, chills, nausea, vomiting, IV drug use.  Has been taking OTC medications also tramadol with mild improvement of her symptoms. ? ?No recent sig neck trauma, no chiropractor manipulation recently.  ? ? ? ? ?Past Medical History: ?No date: Anemia ?No date: Anxiety ?No date: Asthma ?No date: Depression ?No date: Fibromyalgia ?No date: Gallstone ?No date: GERD (gastroesophageal reflux disease) ?No date: H/O hearing loss ?    Comment:  90%left, 10%right ?No date: Headache(784.0) ?No date: Heart murmur ?    Comment:  as a child , outgrown per pt per MD ?No date: History of panic attacks ?    Comment:  after death of mother ?No date: Idiopathic intracranial hypertension ?No date: Migraine ?No date: Papilledema ?No date: Pneumonia ? ?Past Surgical History: ?No date: ANKLE RECONSTRUCTION; Left ?    Comment:  x  2 ?10/03/2021: LAPAROSCOPIC GASTRIC SLEEVE RESECTION; N/A ?    Comment:  Procedure: LAPAROSCOPIC GASTRIC SLEEVE RESECTION;   ?             Surgeon: Greer Pickerel, MD;  Location: [facility] ORS;  Service:  ?             General;  Laterality: N/A; ?No date: UPPER GASTROINTESTINAL ENDOSCOPY ?10/03/2021: UPPER GI ENDOSCOPY; N/A ?    Comment:  Procedure: UPPER GI ENDOSCOPY;  Surgeon: Greer Pickerel,  ?             MD;  Location: [facility] ORS;  Service: General;  Laterality:  ?             N/A;  ? ? ?The history is provided by the patient. No language interpreter was used.  ?Neck Pain ?Associated symptoms: headaches   ?Associated symptoms: no chest pain, no fever and no photophobia   ?Headache ?Associated symptoms: neck pain and neck stiffness   ?Associated symptoms: no abdominal pain, no cough, no fever, no nausea, no photophobia and no vomiting   ? ?  ? ?Home Medications ?Prior to Admission medications   ?Medication Sig Start Date End Date Taking? Authorizing Provider  ?acetaminophen-codeine (TYLENOL #3) 300-30 MG tablet Take 1-2 tablets by mouth every 6 (six) hours as needed for moderate pain. 12/13/21  Yes Jeanell Sparrow, DO  ?butalbital-acetaminophen-caffeine (FIORICET) 50-325-40 MG tablet Take 1-2 tablets by mouth every 6 (six)  hours as needed for headache. 12/13/21 12/13/22 Yes Wynona Dove A, DO  ?ibuprofen (ADVIL) 600 MG tablet Take 1 tablet (600 mg total) by mouth every 6 (six) hours as needed. 12/13/21  Yes Wynona Dove A, DO  ?lidocaine (LIDODERM) 5 % Place 1 patch onto the skin daily as needed. Remove & Discard patch within 12 hours or as directed by MD 12/13/21  Yes Jeanell Sparrow, DO  ?methocarbamol (ROBAXIN) 500 MG tablet Take 2 tablets (1,000 mg total) by mouth 2 (two) times daily for 5 days. 12/13/21 12/18/21 Yes Jeanell Sparrow, DO  ?Cholecalciferol (VITAMIN D3 PO) Take 1 tablet by mouth daily.    [provider]  ?diclofenac Sodium (VOLTAREN) 1 % GEL Apply 2 g topically 4 (four) times daily as needed (knee pain).     [provider]  ?lamoTRIgine (LAMICTAL) 100 MG tablet Take 100 mg by mouth daily. 08/18/21   [provider]  ?lidocaine (LMX) 4 % cream Apply 1 application topically as needed (back pain).    [provider]  ?Multiple Vitamins-Minerals (MULTI ADULT GUMMIES PO) Take 1 each by mouth daily.    [provider]  ?ondansetron (ZOFRAN-ODT) 4 MG disintegrating tablet Dissolve 1 tablet (4 mg total) by mouth every 6 (six) hours as needed for nausea or vomiting. 10/04/21   Greer Pickerel, MD  ?pantoprazole (PROTONIX) 40 MG tablet Take 1 tablet (40 mg total) by mouth daily. 10/04/21   Greer Pickerel, MD  ?traMADol (ULTRAM) 50 MG tablet Take 1 tablet (50 mg total) by mouth every 6 (six) hours as needed (pain). 10/04/21   Greer Pickerel, MD  ?   ? ?Allergies    ?Penicillins   ? ?Review of Systems   ?Review of Systems  ?Constitutional:  Negative for chills and fever.  ?HENT:  Negative for facial swelling and trouble swallowing.   ?Eyes:  Negative for photophobia and visual disturbance.  ?Respiratory:  Negative for cough and shortness of breath.   ?Cardiovascular:  Negative for chest pain and palpitations.  ?Gastrointestinal:  Negative for abdominal pain, nausea and vomiting.  ?Endocrine: Negative for polydipsia and polyuria.  ?Genitourinary:  Negative for difficulty urinating and hematuria.  ?Musculoskeletal:  Positive for neck pain and neck stiffness. Negative for gait problem and joint swelling.  ?Skin:  Negative for pallor and rash.  ?Neurological:  Positive for headaches. Negative for syncope.  ?Psychiatric/Behavioral:  Negative for agitation and confusion.   ? ?Physical Exam ?Updated Vital Signs ?BP (!) 145/94   Pulse 88   Temp 97.7 ?F (36.5 ?C) (Oral)   Resp 19   Ht 5\' 6"  (1.676 m)   Wt 100.2 kg   LMP 12/08/2021   SpO2 100%   BMI 35.67 kg/m?  ?Physical Exam ?Vitals and nursing note reviewed.  ?Constitutional:   ?   General: She is not in acute distress. ?   Appearance: Normal appearance. She  is well-developed.  ?HENT:  ?   Head: Normocephalic and atraumatic. No raccoon eyes, Battle's sign, right periorbital erythema or left periorbital erythema.  ?   Jaw: There is normal jaw occlusion. No trismus.  ?   Comments: No carotid bruit ?   Right Ear: External ear normal.  ?   Left Ear: External ear normal.  ?   Nose: Nose normal.  ?   Mouth/Throat:  ?   Mouth: Mucous membranes are moist.  ?Eyes:  ?   General: Vision grossly intact. Gaze aligned appropriately. No visual field deficit or scleral icterus.    ?  Right eye: No discharge.     ?   Left eye: No discharge.  ?   Extraocular Movements: Extraocular movements intact.  ?   Right eye: Normal extraocular motion.  ?   Left eye: Normal extraocular motion.  ?   Pupils: Pupils are equal, round, and reactive to light. Pupils are equal.  ?Neck:  ?   Trachea: Trachea normal.  ?   Meningeal: Kernig's sign absent.  ?   Comments: Tenderness to the posterior portion of her neck right lateral aspect, muscle spasm noted ?Cardiovascular:  ?   Rate and Rhythm: Normal rate and regular rhythm.  ?   Pulses: Normal pulses.  ?   Heart sounds: Normal heart sounds.  ?Pulmonary:  ?   Effort: Pulmonary effort is normal. No respiratory distress.  ?   Breath sounds: Normal breath sounds.  ?Abdominal:  ?   General: Abdomen is flat.  ?   Tenderness: There is no abdominal tenderness.  ?Musculoskeletal:     ?   General: Normal range of motion.  ?   Cervical back: Normal range of motion.  ?   Right lower leg: No edema.  ?   Left lower leg: No edema.  ?Skin: ?   General: Skin is warm and dry.  ?   Capillary Refill: Capillary refill takes less than 2 seconds.  ?Neurological:  ?   Mental Status: She is alert and oriented to person, place, and time.  ?   GCS: GCS eye subscore is 4. GCS verbal subscore is 5. GCS motor subscore is 6.  ?   Cranial Nerves: Cranial nerves 2-12 are intact. No dysarthria.  ?   Sensory: Sensation is intact. No sensory deficit.  ?   Motor: Motor function is intact. No  weakness or tremor.  ?   Coordination: Coordination is intact. Finger-Nose-Finger Test normal.  ?Psychiatric:     ?   Mood and Affect: Mood normal.     ?   Behavior: Behavior normal.  ? ? ?ED Results / Procedures / Treatm

## 2021-12-13 NOTE — ED Provider Triage Note (Signed)
Emergency Medicine Provider Triage Evaluation Note  Maat Kafer , a 41 y.o. female  was evaluated in triage.  Pt complains of neck pain since Friday, along with headache. No relief from tylenol, alternating heat/cold, topical anesthetics. .  Review of Systems  Positive: N eck pain, limited ROM Negative: Injury, extremity weakness or paresthesias  Physical Exam  BP (!) 156/118 (BP Location: Left Arm)   Pulse (!) 123   Temp 97.7 F (36.5 C) (Oral)   Resp 18   Ht 5\' 6"  (1.676 m)   Wt 100.2 kg   SpO2 100%   BMI 35.67 kg/m  Gen:   Awake, no distress   Resp:  Normal effort  MSK:   LimitednROM due to pain Other:    Medical Decision Making  Medically screening exam initiated at 1:01 PM.  Appropriate orders placed.  Jaedan Borel was informed that the remainder of the evaluation will be completed by another provider, this initial triage assessment does not replace that evaluation, and the importance of remaining in the ED until their evaluation is complete.     , NP 12/13/21 1641

## 2021-12-13 NOTE — ED Notes (Addendum)
Per provider patients c-spine has been cleared  ?

## 2022-03-06 ENCOUNTER — Ambulatory Visit: Payer: BC Managed Care – PPO

## 2023-05-09 ENCOUNTER — Encounter (HOSPITAL_COMMUNITY): Payer: Self-pay | Admitting: *Deleted

## 2023-10-31 ENCOUNTER — Ambulatory Visit: Payer: Self-pay | Admitting: Orthopedic Surgery

## 2024-03-06 NOTE — Progress Notes (Signed)
 Subjective   Annual Physical  Exam  Dana Frost is a 43 y.o. (DOB February 25, 1981) female.     Patient presents with  . Annual Exam    (Alone)  . Insomnia  . Obesity  . Memory Loss  . Gaps In Care    Mammogram//Paper to scheduled     Since last seen has had increased stressors- making it harder to sleep- wakes up several times per night, feeling tired-estimated about 5 hrs of sleep per night, brain fog  Has also noted fluctuations in weight.  Did see [facility] therapist this past year- not currently due to time constraints.  Has noted some irregular menses or intermenstrual spotting.    BP Readings from Last 6 Encounters:  03/06/24 (!) 118/92  03/05/23 100/70  10/13/21 116/80  08/18/21 129/82   Wt Readings from Last 3 Encounters:  03/06/24 220 lb 9.6 oz (100.1 kg)  11/04/23 180 lb (81.6 kg)  03/05/23 177 lb 9.6 oz (80.6 kg)    Health Maintenance/Screening:  Health Maintenance  Topic Date Due  . Mammogram  01/20/2023  . COVID-19 Vaccine (4 - 2024-25 season) 03/31/2023  . DTaP/Tdap/Td Vaccines (3 - Td or Tdap) 04/19/2024  . Pap Smear  07/12/2024  . Adult Wellness Exam  03/06/2025  . Zoster Vaccine (1 of 2) 01/10/2031  . HPV Vaccine (No Doses Required) Completed  . Meningococcal B Vaccine  Aged Out  . Pneumococcal Vaccine: Pediatrics and At Risk Patients  Aged Out  . Hepatitis A Vaccine  Aged Out  . Meningococcal Conjugate Vaccine  Aged Out     Immunizations Immunization History  Administered Date(s) Administered  . Influenza Flulaval Quad 0.33ml 04/20/2014, 06/16/2015, 06/20/2016, 04/07/2018  . Influenza Tri 05/07/2019  . Influenza virus vaccine, unspecified formulation 04/20/2014, 06/16/2015, 06/20/2016  . Moderna Covid-19 Vaccine Monovalent 186mcg/0.5ml IM 09/24/2019, 10/20/2019, 05/19/2020  . PPD Test 06/18/2014  . Pneumococcal Polysaccharide 04/19/2014  . Tdap 02/11/2012, 04/19/2014    Lifestyle:  Body mass index is 34.55 kg/m. Diet:  general Exercise  frequency:  frequently Exercise type:  walking  Gynecologic Health: G2P2000 Rec gyn followup  Problem List[1]  Allergies Patient is allergic to penicillins.  Past Medical History Patient  has a past medical history of Bipolar depression (*) (2020), Fibromyalgia (2005), Obesity (2009), OCD (obsessive compulsive disorder), and Pseudotumor cerebri (2009).  Past Surgical History Patient  has a past surgical history that includes Ankle ligament reconstruction (1998); broken foot (2011); Endoscopy (1993); and gastric sleeve (09/2021).  Social History Patient  reports that she has never smoked. She has never been exposed to tobacco smoke. She has never used smokeless tobacco. She reports current alcohol use of about 2.0 - 6.0 standard drinks of alcohol per week. She reports that she does not use drugs.  Family History Patient's family history includes Breast cancer in her maternal grandmother, maternal uncle, and paternal grandmother; Cancer in her father and mother; Hepatitis in her father; Liver cancer in her mother.  Review of Systems  Pertinent positives: see a/p Otherwise ROS is negative.   Objective   BP (!) 118/92 (BP Location: Left Upper Arm)   Pulse 77   Temp 97.7 F (36.5 C) (Temporal)   Resp 16   Ht 5' 7 (1.702 m)   Wt 220 lb 9.6 oz (100.1 kg)   LMP 03/06/2024 (Exact Date)   SpO2 99%   Breastfeeding No   BMI 34.55 kg/m   General: Alert and Oriented.  No acute distress. HEENT: Normocephalic, atraumatic. Sclera noniecteric,  conjunctiva clear Neck:  Supple with no lymphadenopathy, masses, or thyromegaly. Lungs:   The lungs are clear to auscultation bilaterally with no wheezes or crackles Cardio:  RRR, No murmur, rub, or gallops. Abdomen:  Normal bowel sounds. soft and non tender without rebound or guarding,  no masses are noted.  No hepatosplenomegaly is noted.  Skin:  No rashes or worrisome lesions are noted.  Extremities:  No pretibial edema Neuro: Gait normal.   CN 2-11 are grossly normal.  Psych:  Normal mood and affect      Assessment/Plan   Annual Physical Exam Health maintenance issues including appropriate cancer screening, healthy diet, exercise, and tobacco avoidance/cessation were discussed with the patient.   Routine labs and screening tests ordered.  Consider daily women's multivitamin for adequate folic acid in reproductive aged woman. Contraception counseling performed Follow-up annually  History of sleeve gastrectomy Significant weight gain, continue MVI, has not had nutritional labs checked in past year- will check today- refer to bariatrics for comprehensive support with recent weight gain.   Bipolar disorder (*)  She reports moods have not been labile and would like to continued lamotrigine .  Would benefit from therapy for increase in life stressors- encouraged her to schedule  Irregular menses  Intermenstrual bleeding in the setting of rapid weight gain in past few months. Husband with vasectomy.  Will check labs, rec to follow-up with gyn for further evaluation   Fatigue  She reports brain fog  and fatigue- we discussed important of stress reduction which appears to be main cause of interrupted sleep.  Also discussed caffeine  and alcohol can disrupt sleep. No recent mania or snoring to suggest sleep apnea.   Will check labs   Written patient instructions printed and documented in After Visit [facility]  Orders Placed This Encounter  Procedures  . CBC And Differential  . Comprehensive Metabolic Panel  . Lipid Panel  . TSH Rfx on Abnormal to Free T4  . Vitamin D 25 Hydroxy  . Vitamin B12  . Iron and TIBC  . AMB REFERRAL TO BARIATRICS    Medications at end of visit: Current Medications[2]       [1] Patient Active Problem List Diagnosis  . Fibromyalgia  . Chronic migraine without aura  . Pseudotumor cerebri  . Bipolar disorder (*)  . Thyroid nodule  . History of sleeve gastrectomy  . Fatigue  . Knee pain   . Plantar fasciitis  . Irregular menses  [2]  Current Outpatient Medications:  .  lamoTRIgine  (LAMICTAL ) 100 mg tablet, Take one tablet (100 mg dose) by mouth daily., Disp: 90 tablet, Rfl: 3 .  Multiple Vitamin (MULTIVITAMIN) capsule, Take one capsule by mouth daily., Disp: , Rfl:

## 2024-03-15 IMAGING — CT CT ANGIO HEAD-NECK (W OR W/O PERF)
3 of 11 series · 8 of 34 positions shown · non-contrast
Comparison: Brain MRI 05/08/2017

CLINICAL DATA: Neck pain and headache

EXAM:
CT ANGIOGRAPHY HEAD AND NECK
TECHNIQUE: Multidetector CT imaging of the head and neck was performed using
the standard protocol during bolus administration of intravenous
contrast. Multiplanar CT image reconstructions and MIPs were
obtained to evaluate the vascular anatomy. Carotid stenosis
measurements (when applicable) are obtained utilizing NASCET
criteria, using the distal internal carotid diameter as the
denominator.

[Series 9: sagittal soft tissue · sagittal · 0.31mm/px · 1 of 49 slices shown]
[im 14/49  soft-tissue]
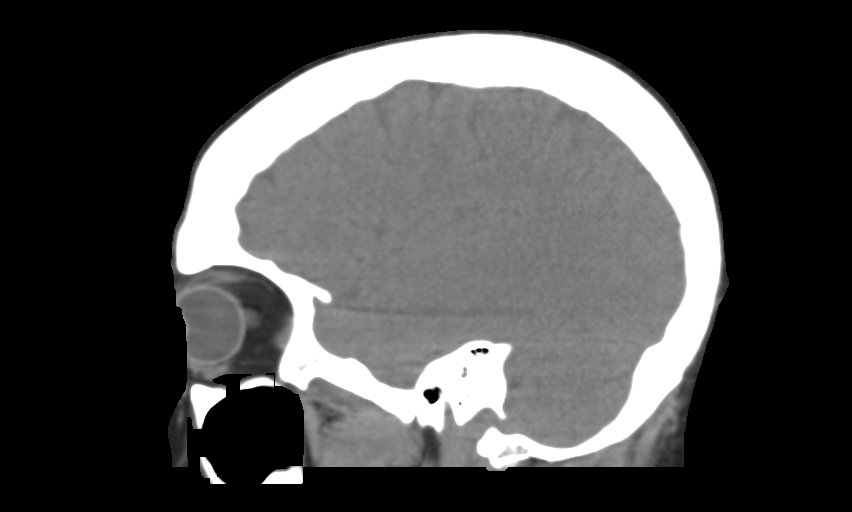

[Series 10: cta head neck · axial · 0.60mm/px · z∈[+1072,+1188]mm · 2 of 175 slices shown]
[im 59/175  soft-tissue]
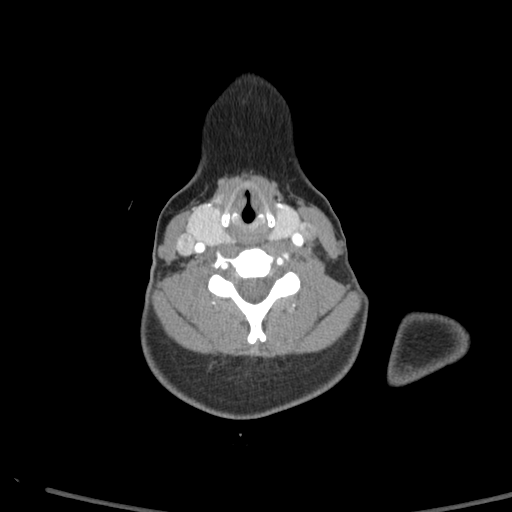
[im 117/175  bone]
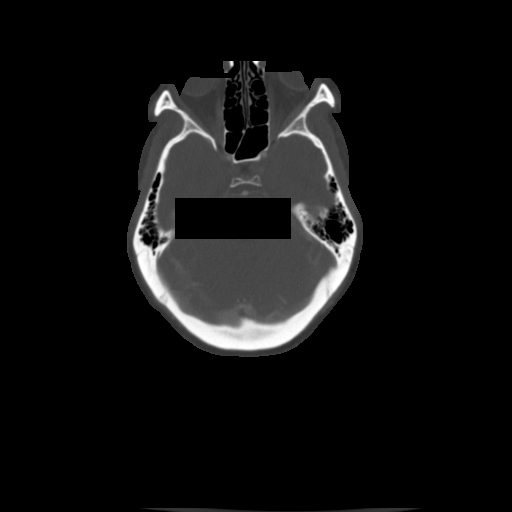

[Series 12: ax thin · axial · 0.57mm/px · z∈[+962,+1179]mm · 5 of 341 slices shown]
[im 57/341  soft-tissue]
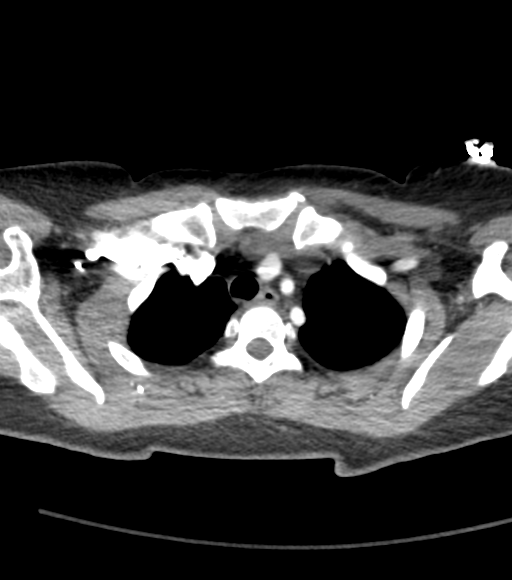
[im 114/341  soft-tissue]
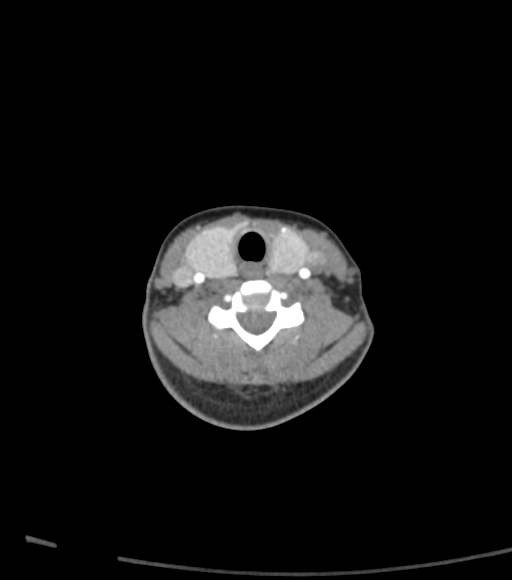
[im 171/341  soft-tissue]
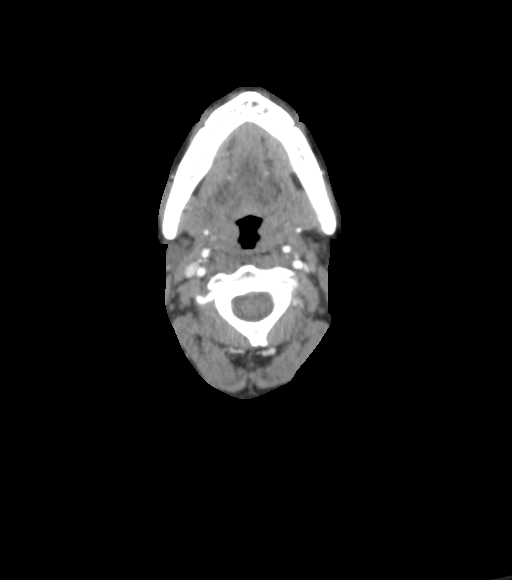
[im 227/341  soft-tissue]
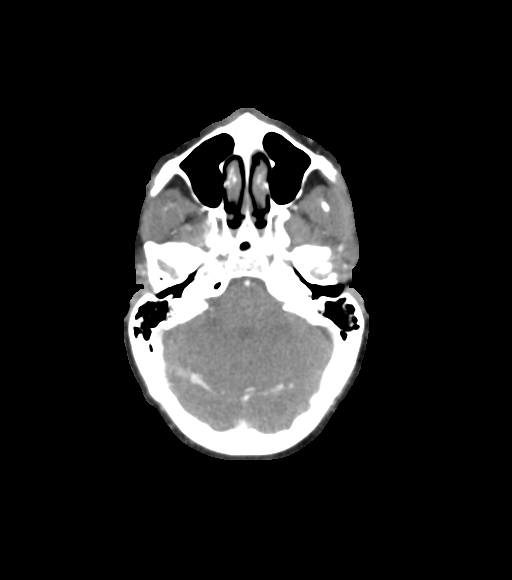
[im 284/341  soft-tissue]
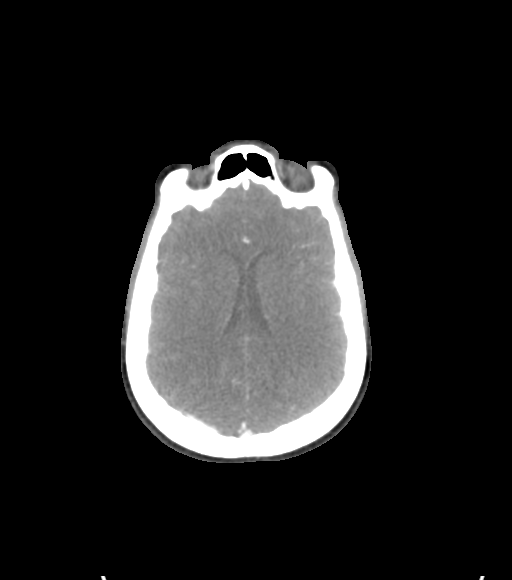

[8 of 34 positions shown; findings below may reference images not displayed]

RADIATION DOSE REDUCTION: This exam was performed according to the
departmental dose-optimization program which includes automated
exposure control, adjustment of the mA and/or kV according to
patient size and/or use of iterative reconstruction technique.

CONTRAST:  75mL OMNIPAQUE IOHEXOL 350 MG/ML SOLN
FINDINGS: CT HEAD FINDINGS

Brain: There is no mass, hemorrhage or extra-axial collection. The
size and configuration of the ventricles and extra-axial CSF spaces
are normal. There is no acute or chronic infarction. The brain
parenchyma is normal.

Skull: The visualized skull base, calvarium and extracranial soft
tissues are normal.

Sinuses/Orbits: No fluid levels or advanced mucosal thickening of
the visualized paranasal sinuses. No mastoid or middle ear effusion.
The orbits are normal.

CTA NECK FINDINGS

SKELETON: There is no bony spinal canal stenosis. No lytic or
blastic lesion.

OTHER NECK: Normal pharynx, larynx and major salivary glands. No
cervical lymphadenopathy. There is a 1.9 cm right thyroid nodule.

UPPER CHEST: No pneumothorax or pleural effusion. No nodules or
masses.

AORTIC ARCH:

There is no calcific atherosclerosis of the aortic arch. There is no
aneurysm, dissection or hemodynamically significant stenosis of the
visualized portion of the aorta. Conventional 3 vessel aortic
branching pattern. The visualized proximal subclavian arteries are
widely patent.

RIGHT CAROTID SYSTEM: Normal without aneurysm, dissection or
stenosis.

LEFT CAROTID SYSTEM: Normal without aneurysm, dissection or
stenosis.

VERTEBRAL ARTERIES: Left dominant configuration. Both origins are
clearly patent. There is no dissection, occlusion or flow-limiting
stenosis to the skull base (V1-V3 segments).

CTA HEAD FINDINGS

POSTERIOR CIRCULATION:

--Vertebral arteries: Normal V4 segments.

--Inferior cerebellar arteries: Normal.

--Basilar artery: Normal.

--Superior cerebellar arteries: Normal.

--Posterior cerebral arteries (PCA): Normal.

ANTERIOR CIRCULATION:

--Intracranial internal carotid arteries: Normal.

--Anterior cerebral arteries (ACA): Normal. Both A1 segments are
present. Patent anterior communicating artery (a-comm).

--Middle cerebral arteries (MCA): Normal.

VENOUS SINUSES: As permitted by contrast timing, patent.

ANATOMIC VARIANTS: None

Review of the MIP images confirms the above findings.
IMPRESSION: 1. Normal CTA of the head and neck.
2. 1.9 cm incidental right thyroid nodule. Recommend thyroid US.
Reference: [HOSPITAL]. [DATE]): 143-50

## 2024-03-19 ENCOUNTER — Encounter (INDEPENDENT_AMBULATORY_CARE_PROVIDER_SITE_OTHER): Payer: Self-pay

## 2024-04-21 ENCOUNTER — Encounter (HOSPITAL_BASED_OUTPATIENT_CLINIC_OR_DEPARTMENT_OTHER): Payer: Self-pay | Admitting: Urology

## 2024-04-21 ENCOUNTER — Other Ambulatory Visit: Payer: Self-pay

## 2024-04-21 ENCOUNTER — Emergency Department (HOSPITAL_BASED_OUTPATIENT_CLINIC_OR_DEPARTMENT_OTHER)
Admission: EM | Admit: 2024-04-21 | Discharge: 2024-04-21 | Disposition: A | Discharge status: Home / Self Care | Attending: Emergency Medicine | Admitting: Emergency Medicine

## 2024-04-21 ENCOUNTER — Emergency Department (HOSPITAL_BASED_OUTPATIENT_CLINIC_OR_DEPARTMENT_OTHER)

## 2024-04-21 DIAGNOSIS — N939 Abnormal uterine and vaginal bleeding, unspecified: Secondary | ICD-10-CM | POA: Diagnosis present

## 2024-04-21 DIAGNOSIS — J45909 Unspecified asthma, uncomplicated: Secondary | ICD-10-CM | POA: Insufficient documentation

## 2024-04-21 DIAGNOSIS — Z87891 Personal history of nicotine dependence: Secondary | ICD-10-CM | POA: Diagnosis not present

## 2024-04-21 DIAGNOSIS — D219 Benign neoplasm of connective and other soft tissue, unspecified: Secondary | ICD-10-CM

## 2024-04-21 DIAGNOSIS — N888 Other specified noninflammatory disorders of cervix uteri: Secondary | ICD-10-CM

## 2024-04-21 LAB — CBC
HCT: 40.3 % (ref 36.0–46.0)
Hemoglobin: 13.5 g/dL (ref 12.0–15.0)
MCH: 30.1 pg (ref 26.0–34.0)
MCHC: 33.5 g/dL (ref 30.0–36.0)
MCV: 90 fL (ref 80.0–100.0)
Platelets: 221 K/uL (ref 150–400)
RBC: 4.48 MIL/uL (ref 3.87–5.11)
RDW: 11.6 % (ref 11.5–15.5)
WBC: 7.9 K/uL (ref 4.0–10.5)
nRBC: 0 % (ref 0.0–0.2)

## 2024-04-21 LAB — PROTIME-INR
INR: 1 (ref 0.8–1.2)
Prothrombin Time: 13.3 s (ref 11.4–15.2)

## 2024-04-21 LAB — BASIC METABOLIC PANEL WITH GFR
Anion gap: 11 (ref 5–15)
BUN: 14 mg/dL (ref 6–20)
CO2: 24 mmol/L (ref 22–32)
Calcium: 9.2 mg/dL (ref 8.9–10.3)
Chloride: 103 mmol/L (ref 98–111)
Creatinine, Ser: 0.82 mg/dL (ref 0.44–1.00)
GFR, Estimated: >60 mL/min (ref >60)
Glucose, Bld: 118 mg/dL — ABNORMAL HIGH (ref 70–99)
Potassium: 3.7 mmol/L (ref 3.5–5.1)
Sodium: 138 mmol/L (ref 135–145)

## 2024-04-21 LAB — HCG, SERUM, QUALITATIVE: Preg, Serum: NEGATIVE

## 2024-04-21 MED ORDER — TRANEXAMIC ACID 650 MG PO TABS
1300.0000 mg | ORAL_TABLET | Freq: Three times a day (TID) | ORAL | 0 refills | Status: AC
Start: 2024-04-21 — End: 2024-04-26

## 2024-04-21 NOTE — ED Provider Notes (Signed)
 [facility] Provider Note   CSN: [id number] Arrival date & time: 04/21/24  1353     Patient presents with: Vaginal Bleeding   Dana Frost is a 43 y.o. female.    Vaginal Bleeding   43 year old female presents emergency department complaints of vaginal bleeding.  States that this began around 1030 this morning.  Reports 2-3 pads an hour with heavier clots than usual.  States that her last menstrual cycle was around a month ago and is due around now.  Denies any change in pelvic pain, abdominal pain, fever, chills, vaginal discharge, urinary symptoms.  States that the amount of bleeding is atypical for her.  Does state that she has uterine fibroids.  Not currently on any hormonal therapy.  Not concerned about pregnancy as husband has a vasectomy.  Is not sexually active with any other partner.  Past medical history; anemia, asthma, fibromyalgia, GERD, MDD, IIH, chronic migraine, panic attack  Prior to Admission medications   Medication Sig Start Date End Date Taking? Authorizing Provider  acetaminophen -codeine  (TYLENOL  #3) 300-30 MG tablet Take 1-2 tablets by mouth every 6 (six) hours as needed for moderate pain. 12/13/21   Elnor Jayson LABOR, DO  Cholecalciferol (VITAMIN D3 PO) Take 1 tablet by mouth daily.    [provider]  diclofenac Sodium (VOLTAREN) 1 % GEL Apply 2 g topically 4 (four) times daily as needed (knee pain).    [provider]  ibuprofen  (ADVIL ) 600 MG tablet Take 1 tablet (600 mg total) by mouth every 6 (six) hours as needed. 12/13/21   Elnor Jayson LABOR, DO  lamoTRIgine  (LAMICTAL ) 100 MG tablet Take 100 mg by mouth daily. 08/18/21   [provider]  lidocaine  (LIDODERM ) 5 % Place 1 patch onto the skin daily as needed. Remove & Discard patch within 12 hours or as directed by MD 12/13/21   Elnor Jayson A, DO  lidocaine  (LMX) 4 % cream Apply 1 application topically as needed (back pain).    [provider]  Multiple Vitamins-Minerals (MULTI ADULT GUMMIES PO) Take 1 each by mouth daily.    [provider]  ondansetron  (ZOFRAN -ODT) 4 MG disintegrating tablet Dissolve 1 tablet (4 mg total) by mouth every 6 (six) hours as needed for nausea or vomiting. 10/04/21   Tanda Locus, MD  pantoprazole  (PROTONIX ) 40 MG tablet Take 1 tablet (40 mg total) by mouth daily. 10/04/21   Tanda Locus, MD  traMADol  (ULTRAM ) 50 MG tablet Take 1 tablet (50 mg total) by mouth every 6 (six) hours as needed (pain). 10/04/21   Tanda Locus, MD    Allergies: Penicillins    Review of Systems  Genitourinary:  Positive for vaginal bleeding.  All other systems reviewed and are negative.   Updated Vital Signs BP 133/86 (BP Location: Right Arm)   Pulse (!) 109   Temp 97.7 F (36.5 C)   Resp 18   Ht 5' 6 (1.676 m)   Wt 100.2 kg   LMP 03/23/2024   SpO2 100%   BMI 35.65 kg/m   Physical Exam Vitals and nursing note reviewed. Exam conducted with a chaperone present.  Constitutional:      General: She is not in acute distress.    Appearance: She is well-developed.  HENT:     Head: Normocephalic and atraumatic.  Eyes:     Conjunctiva/sclera: Conjunctivae normal.  Cardiovascular:     Rate and Rhythm: Normal rate and regular rhythm.  Heart sounds: No murmur heard. Pulmonary:     Effort: Pulmonary effort is normal. No respiratory distress.     Breath sounds: Normal breath sounds.  Abdominal:     Palpations: Abdomen is soft.     Tenderness: There is no abdominal tenderness.  Genitourinary:    Comments: Dark red-colored blood appreciated vaginal vault.  Clot appearing at the cervical os without active bleeding at this time.  Patient states that bleeding is slowed.  Manual exam was not performed. Musculoskeletal:        General: No swelling.     Cervical back: Neck supple.  Skin:    General: Skin is warm and dry.     Capillary Refill: Capillary refill takes less than 2 seconds.   Neurological:     Mental Status: She is alert.  Psychiatric:        Mood and Affect: Mood normal.     (all labs ordered are listed, but only abnormal results are displayed) Labs Reviewed  CBC  BASIC METABOLIC PANEL WITH GFR  HCG, SERUM, QUALITATIVE  PROTIME-INR    EKG: None  Radiology: No results found.   Procedures   Medications Ordered in the ED - No data to display                                  Medical Decision Making Amount and/or Complexity of Data Reviewed Labs: ordered. Radiology: ordered.  Risk Prescription drug management.   This patient presents to the ED for concern of vaginal bleeding, this involves an extensive number of treatment options, and is a complaint that carries with it a high risk of complications and morbidity.  The differential diagnosis includes fibroid, ovarian dysfunction, malignancy, pregnancy, coagulopathy, other   Co morbidities that complicate the patient evaluation  See HPI   Additional history obtained:  Additional history obtained from EMR External records from outside source obtained and reviewed including hospital records   Lab Tests:  I Ordered, and personally interpreted labs.  The pertinent results include: No leukocytosis.  No evidence of anemia.  Platelets within range.  PT/INR within normal limits.  No abnormalities.  No renal dysfunction.  hCG negative.   Imaging Studies ordered:  I ordered imaging studies including pelvic ultrasound I independently visualized and interpreted imaging which showed uterine fibroid.  Nabothian cyst. I agree with the radiologist interpretation   Cardiac Monitoring: / EKG:  The patient was maintained on a cardiac monitor.  I personally viewed and interpreted the cardiac monitored which showed an underlying rhythm of: sinus rhythm   Consultations Obtained:  N/a   Problem List / ED Course / Critical interventions / Medication management  Abnormal uterine  bleeding Reevaluation of the patient showed that the patient stayed the same I have reviewed the patients home medicines and have made adjustments as needed   Social Determinants of Health:  Former cigarette use.  Denies illicit drug use.   Test / Admission - Considered:  Abnormal uterine bleeding Vitals signs significant for initial tachycardia. Otherwise within normal range and stable throughout visit. Laboratory/imaging studies significant for: See above 43 year old female presents emergency department complaints of vaginal bleeding.  States that this began around 1030 this morning.  Reports 2-3 pads an hour with heavier clots than usual.  States that her last menstrual cycle was around a month ago and is due around now.  Denies any change in pelvic pain, abdominal pain, fever, chills,  vaginal discharge, urinary symptoms.  States that the amount of bleeding is atypical for her.  Does state that she has uterine fibroids.  Not currently on any hormonal therapy.  Not concerned about pregnancy as husband has a vasectomy.  Is not sexually active with any other partner. On exam, no real reproducible abdominal tenderness.  Pelvic exam showed dark red blood in vaginal vault with clot appearance of cervical os without active bleeding at this time.  Labs unremarkable for acute emergent process.  Normal hemoglobin.  Ultrasound showed uterine fibroid which is most likely etiology of patient's vaginal bleeding.  Patient's 3-hour stay in the ED showed significant improvement of bleeding.  Offered patient treatment options for abnormal uterine bleeding including hormonal therapy, TXA, instead of which she elected for TXA after risks/benefits of each therapy was discussed.  Will recommend close follow-up with OB/GYN in the outpatient setting for reassessment.  Treatment plan discussed with patient she is understanding was agreeable.  Patient well-appearing, afebrile in no acute distress upon discharge. Worrisome  signs and symptoms were discussed with the patient, and the patient acknowledged understanding to return to the ED if noticed. Patient was stable upon discharge.       Final diagnoses:  None    ED Discharge Orders     None          Silver Wonda LABOR, [place] 04/21/24 1730    Emil Share, DO 04/21/24 [phone]

## 2024-04-21 NOTE — Discharge Instructions (Signed)
 As discussed, your ultrasound showed uterine fibroid which is most likely causing the bleeding.  Your hemoglobin was normal.  Will send you home with TXA to use for any breakthrough bleeding.  Recommend follow-up with your OB/GYN for reassessment.

## 2024-04-21 NOTE — ED Triage Notes (Signed)
 Pt states heavy abnormal vaginal bleeding with large clots since 1030 this am  Soaking through approx 3 -4 pads/hr  Has been having heavier periods since the spring   No concern for possible preg, husband cleared vasectomy years ago

## 2024-04-21 NOTE — ED Notes (Signed)
 IV removed.

## 2024-04-29 ENCOUNTER — Encounter (HOSPITAL_COMMUNITY): Payer: Self-pay | Admitting: *Deleted

## 2024-04-30 ENCOUNTER — Encounter (INDEPENDENT_AMBULATORY_CARE_PROVIDER_SITE_OTHER): Payer: Self-pay

## 2024-05-21 ENCOUNTER — Other Ambulatory Visit: Payer: Self-pay | Admitting: Obstetrics and Gynecology

## 2024-05-21 DIAGNOSIS — R1900 Intra-abdominal and pelvic swelling, mass and lump, unspecified site: Secondary | ICD-10-CM

## 2024-05-25 ENCOUNTER — Ambulatory Visit
Admission: RE | Admit: 2024-05-25 | Discharge: 2024-05-25 | Disposition: A | Source: Ambulatory Visit | Attending: Obstetrics and Gynecology | Admitting: Obstetrics and Gynecology

## 2024-05-25 DIAGNOSIS — R1900 Intra-abdominal and pelvic swelling, mass and lump, unspecified site: Secondary | ICD-10-CM

## 2024-05-25 MED ORDER — GADOPICLENOL 0.5 MMOL/ML IV SOLN
10.0000 mL | Freq: Once | INTRAVENOUS | Status: AC | PRN
  Administered 2024-05-25: 10 mL via INTRAVENOUS

## 2024-05-26 ENCOUNTER — Other Ambulatory Visit: Payer: Self-pay | Admitting: Obstetrics and Gynecology

## 2024-05-26 DIAGNOSIS — R928 Other abnormal and inconclusive findings on diagnostic imaging of breast: Secondary | ICD-10-CM

## 2024-05-26 DIAGNOSIS — N644 Mastodynia: Secondary | ICD-10-CM

## 2024-05-27 ENCOUNTER — Encounter (INDEPENDENT_AMBULATORY_CARE_PROVIDER_SITE_OTHER): Payer: Self-pay

## 2024-05-27 ENCOUNTER — Institutional Professional Consult (permissible substitution) (INDEPENDENT_AMBULATORY_CARE_PROVIDER_SITE_OTHER): Payer: Self-pay | Admitting: Nurse Practitioner

## 2024-06-01 ENCOUNTER — Encounter: Payer: Self-pay | Admitting: Radiology

## 2024-06-08 ENCOUNTER — Telehealth: Admitting: Physician Assistant

## 2024-06-08 DIAGNOSIS — M545 Low back pain, unspecified: Secondary | ICD-10-CM | POA: Diagnosis not present

## 2024-06-08 DIAGNOSIS — N946 Dysmenorrhea, unspecified: Secondary | ICD-10-CM

## 2024-06-08 MED ORDER — PREDNISONE 20 MG PO TABS: 40.0000 mg | ORAL_TABLET | Freq: Every day | ORAL | 0 refills | Status: DC

## 2024-06-08 NOTE — Patient Instructions (Signed)
 Dana Frost, thank you for joining Delon [name], PA-C for today's virtual visit.  While this provider is not your primary care provider (PCP), if your PCP is located in our provider database this encounter information will be shared with them immediately following your visit.   [name] MyChart account gives you access to today's visit and all your visits, tests, and labs performed at [facility]  click here if you don't have a [name] MyChart account or go to [email or web address]  Consent: (Patient) Dana Frost provided verbal consent for this virtual visit at the beginning of the encounter.  Current Medications:  Current Outpatient Medications:    predniSONE (DELTASONE) 20 MG tablet, Take 2 tablets (40 mg total) by mouth daily with breakfast., Disp: 10 tablet, Rfl: 0   lamoTRIgine  (LAMICTAL ) 100 MG tablet, Take 100 mg by mouth daily., Disp: , Rfl:    Multiple Vitamins-Minerals (MULTI ADULT GUMMIES PO), Take 1 each by mouth daily., Disp: , Rfl:    Medications ordered in this encounter:  Meds ordered this encounter  Medications   predniSONE (DELTASONE) 20 MG tablet    Sig: Take 2 tablets (40 mg total) by mouth daily with breakfast.    Dispense:  10 tablet    Refill:  0    Supervising Provider:   BLAISE ALEENE KIDD [[id number]]     *If you need refills on other medications prior to your next appointment, please contact your pharmacy*  Follow-Up: Call back or seek an in-person evaluation if the symptoms worsen or if the condition fails to improve as anticipated.  [facility] [phone]  Other Instructions  Fibroids in the Uterus: What to Know  Fibroids are benign tumors that grow in the uterus. Benign means that they are not cancer. You may have one or more than one fibroid. Fibroids vary in size, weight, and where they grow in the uterus. Some can become quite large. Most fibroids do not need to be treated by a health care  provider. What are the causes? The cause of this condition is not known. What increases the risk? You are more likely to develop this condition if: You're in your 30s or 40s and you've not gone through menopause. Menopause is a time when a person no longer has a period. You have family members who have had fibroids. You're of African American descent. You started your monthly period at age 90 or younger. You've never given birth. You're overweight or obese. You have a diet low in fruits, vegetables, and vitamin D. What are the signs or symptoms? Many people do not have symptoms. If you have symptoms, they may include: Heavy bleeding during a period. Bleeding between periods. Pain and pressure in the area between the hip bones (pelvis). Pain during sex. Bladder problems, such as needing to pee right away or more often than usual. Inability to get pregnant. Not being able to carry a pregnancy to term (miscarriage). How is this diagnosed? This condition may be diagnosed based on: Your symptoms and medical history. A physical exam, including feeling for any tumors. Imaging tests, such as an ultrasound or MRI. How is this treated? This condition may be treated with: Medicines. You may be given medicines to treat pain. You may also be given hormones. Hormones are given in the form of a pill or a shot. They may also be given as an IUD. Surgery. This may be done to: Take out the fibroids. This is done if the fibroids  are making it hard for you to get pregnant. Take out the uterus. Block the blood flow to the fibroids. This can shrink the fibroids and make them go away. Other procedures to shrink the fibroids. Your provider may do: An RFA (radiofrequency ablation). This uses heat and radio energy to shrink the fibroids. An MRI-guided ultrasound. This uses ultrasound waves to shrink the fibroids. This is done through the skin. No cuts are made on the skin. Follow these instructions at  home: Medicines Take your medicines only as told. Ask your provider if you should take iron pills or eat more iron-rich foods, such as dark green, leafy vegetables. Heavy bleeding from your period can cause low iron levels in the body. Managing pain  Put heat on your back or belly as told. Use the heat source that your provider recommends, such as a moist heat pack or a heating pad. Do this as often as told. Place a towel between your skin and the heat source. Leave the heat on for 20-30 minutes. If your skin turns red, take off the heat right away to prevent burns. The risk of burns is higher if you can't feel pain, heat, or cold. General instructions Pay close attention to your monthly cycle. Tell your provider about any changes, such as: Heavier bleeding that requires you to change your pads or tampons more than usual. A change in the number of days that your period lasts. A change in symptoms that come with your period, such as back pain or cramps in your belly. Keep all follow-up visits. As part of your treatment, your provider needs to closely monitor your fibroids for any changes. Contact a health care provider if: You have pain in your pelvis or back. Or cramps in your belly that do not get better with medicine or heat. You get new bleeding between your periods. You have worse bleeding during your period, or between them. You feel weaker or more tired than usual. You feel light-headed. Get help right away if: You faint. You have pain in the pelvis that suddenly gets worse. You have very bad bleeding from the vagina that soaks a tampon or pad in 30 minutes or less. This information is not intended to replace advice given to you by your health care provider. Make sure you discuss any questions you have with your health care provider. Document Revised: 01/15/2023 Document Reviewed: 01/15/2023 Elsevier Patient Education  2024 Elsevier Inc.   If you have been instructed to have an  in-person evaluation today at a local Urgent Care facility, please use the link below. It will take you to a list of all of our available [facility], including address, phone number and hours of operation. Please do not delay care.  [facility]  If you or a family member do not have a primary care provider, use the link below to schedule a visit and establish care. When you choose a [facility] primary care physician or advanced practice provider, you gain a long-term partner in health. Find a Primary Care Provider  Learn more about [facility]'s in-office and virtual care options:  - Get Care Now

## 2024-06-08 NOTE — Progress Notes (Signed)
 Virtual Visit Consent   Dana Frost, you are scheduled for a virtual visit with a [facility] provider today. Just as with appointments in the office, your consent must be obtained to participate. Your consent will be active for this visit and any virtual visit you may have with one of our providers in the next 365 days. If you have a MyChart account, a copy of this consent can be sent to you electronically.  As this is a virtual visit, video technology does not allow for your provider to perform a traditional examination. This may limit your provider's ability to fully assess your condition. If your provider identifies any concerns that need to be evaluated in person or the need to arrange testing (such as labs, EKG, etc.), we will make arrangements to do so. Although advances in technology are sophisticated, we cannot ensure that it will always work on either your end or our end. If the connection with a video visit is poor, the visit may have to be switched to a telephone visit. With either a video or telephone visit, we are not always able to ensure that we have a secure connection.  By engaging in this virtual visit, you consent to the provision of healthcare and authorize for your insurance to be billed (if applicable) for the services provided during this visit. Depending on your insurance coverage, you may receive a charge related to this service.  I need to obtain your verbal consent now. Are you willing to proceed with your visit today? Dana Frost has provided verbal consent on 06/08/2024 for a virtual visit (video or telephone). Dana [name], PA-C  Date: 06/08/2024 11:27 AM   Virtual Visit via Video Note   IDelon CHRISTELLA Frost, connected with  Dana Frost  ([id number], [birth year]) on 06/08/24 at 11:15 AM EST by a video-enabled telemedicine application and verified that I am speaking with the correct person using two identifiers.  Location: Patient: Virtual Visit Location  Patient: Home Provider: Virtual Visit Location Provider: Home Office   I discussed the limitations of evaluation and management by telemedicine and the availability of in person appointments. The patient expressed understanding and agreed to proceed.    History of Present Illness: Dana Frost is a 43 y.o. who identifies as a female who was assigned female at birth, and is being seen today for back pain and dysmenorrhea. Was seen for same issue at ER on 04/21/24 and found to have a Uterine Fibroid. Symptoms returned yesterday and today with heavy menstrual bleeding and back pain. She does have Tranexamic acid  on hand already for the bleeding. She cannot take NSAIDs due to history of bariatric surgery. She has her appt with OB/GYN scheduled for Wednesday, 06/10/24.   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   Chronic migraine 04/30/2017   Pseudotumor cerebri 04/30/2017   Labor and delivery, indication for care 04/18/2014   Postpartum state 04/18/2014   H/O hearing loss     Allergies:  Allergies  Allergen Reactions   Penicillins Hives   Medications:  Current Outpatient Medications:    predniSONE (DELTASONE) 20 MG tablet, Take 2 tablets (40 mg total) by mouth daily with breakfast., Disp: 10 tablet, Rfl: 0   lamoTRIgine  (LAMICTAL ) 100 MG tablet, Take 100 mg by mouth daily., Disp: , Rfl:    Multiple Vitamins-Minerals (MULTI ADULT GUMMIES PO), Take 1  each by mouth daily., Disp: , Rfl:   Observations/Objective: Patient is well-developed, well-nourished in no acute distress.  Resting comfortably at home.  Head is normocephalic, atraumatic.  No labored breathing.  Speech is clear and coherent with logical content.  Patient is alert and oriented at baseline.    Assessment and Plan: 1. Acute bilateral low back pain without sciatica  (Primary) - predniSONE (DELTASONE) 20 MG tablet; Take 2 tablets (40 mg total) by mouth daily with breakfast.  Dispense: 10 tablet; Refill: 0  2. Dysmenorrhea - predniSONE (DELTASONE) 20 MG tablet; Take 2 tablets (40 mg total) by mouth daily with breakfast.  Dispense: 10 tablet; Refill: 0  - Suspect back pain from dysmenorrhea from Uterine fibroid - Intolerant to NSAIDs due to bariatric surgery history - Add Prednisone burst - Continue Tranexamic acid  as prescribed - Avoid NSAIDs (Ibuprofen /Advil /Motrin  or Naproxen/Aleve) while on steroid - Tylenol  if okay for breakthrough pain - Heat to area - Epsom salt soak if able to get in and out of bath tub safely - Back exercises and stretches provided via AVS - Seek in person evaluation if worsening or fails to improve with treatment   Follow Up Instructions: I discussed the assessment and treatment plan with the patient. The patient was provided an opportunity to ask questions and all were answered. The patient agreed with the plan and demonstrated an understanding of the instructions.  A copy of instructions were sent to the patient via MyChart unless otherwise noted below.    The patient was advised to call back or seek an in-person evaluation if the symptoms worsen or if the condition fails to improve as anticipated.    Dana [name], PA-C

## 2024-06-12 ENCOUNTER — Ambulatory Visit
Admission: RE | Admit: 2024-06-12 | Discharge: 2024-06-12 | Disposition: A | Discharge status: Home / Self Care | Source: Ambulatory Visit | Attending: Obstetrics and Gynecology | Admitting: Obstetrics and Gynecology

## 2024-06-12 DIAGNOSIS — R928 Other abnormal and inconclusive findings on diagnostic imaging of breast: Secondary | ICD-10-CM

## 2024-06-12 DIAGNOSIS — N644 Mastodynia: Secondary | ICD-10-CM

## 2024-08-05 ENCOUNTER — Encounter (HOSPITAL_COMMUNITY): Payer: Self-pay

## 2024-08-05 NOTE — Progress Notes (Signed)
 Surgical Instructions  Your procedure is scheduled on :  Wednesday January 14th, 2026 Report to [facility] Main Entrance A at 8:15 AM, then check in the Admitting office. Any questions or running late day of surgery :  call [phone]  Questions prior to your surgery day:  call [phone], Monday -- Friday 8am - 4pm. If you experience any cold or flu symptoms such as cough, fever, chills, shortness of breath, etc. between now and you scheduled surgery, please notify your surgeon office.   Remember: Do Not eat any food after midnight the night before surgery. You may have clear liquids from midnight night before surgery until 7:15 AM.   Clear liquids allowed are:  Water              Carbonated Beverages (diabetics choose diet or no sugar options)  Clear Tea ( no milk, no honey, etc.)  Black coffee ( NO MILK, CREAM OR POWDERED CREAMER OF ANY KIND)  Sport drinks, like Gatorade (diabetes choose diet or no sugar options)  NO clear liquid after 7:15 AM day of surgery.  This includes No water ,  candy,  gum, and mints.  Take these medicines the morning of surgery with A SIPS OF WATER :  Lamictal    May take these medicines IF NEEDED:  NONE   One week prior to surgery, STOP taking any Aspirin (unless otherwise instructed by your surgeon) Aleve, Naproxen, ibuprofen , Motrin , Advil , Goody's, BC's, all herbal medications/ supplements, fish oil, and non-prescription vitamins.  Do NOT Smoke (tobacco/ vaping) and Do Not drink alcohol for 24 hours prior to your procedure.  For those patients that use a CPAP.  Please bring your CPAP/ mask/ tubing with them day of surgery . Anesthesia may ask recovery room nurse to use and if you stay the night you be asked to use it.  You will be asked to removed any contacts, glasses, piercing's, hearing aid's, dentures/ partials prior to surgery.  Please bring cases/ container/ solution/ etc., for them day of surgery.   Patients discharged the day of surgery will  NOT be allowed to drive home.  You must have responsible driver and caregiver to stay at home with you the next 24 hours.  SURGICAL WAITING ROOM VISITATION Patients may have no more than 2 support people in the waiting area - if more than 2 , these visitors may rotate.  Pre-op nurse will coordinate an appropriate time for 1 Adult support person, who may not rotate, to accompany patient in pre-op.  Aware some patients may have certain circumstances, speak to pre-op nurse day of surgery.  Children under the age 67 must have an adult with them who is not the patient and must remain in the main waiting area with an adult.  If the patient needs to stay at the hospital during part of their recovery, the visitor guidelines for inpatient rooms apply.  Please refer to the [facility] website for the visitor guidelines for any additional information.  If you received a COVID test during your pre-op visit it is requested that you wear a mask when out in public, stay away from anyone that may not be feeling well and notify your surgeon if you develop symptoms.  If you have been in contact with anyone that has tested positive in the past 10 days notify your surgeon.     [facility] - Preparing for Surgery  Before surgery, you can play an important role. Because skin is not sterile, it needs to be as  free of germs as possible. You can reduce the number of germs on your skin by washing with CHG (chlorhexidine  gluconate) soap before surgery. CHG is an antiseptic cleaner which kills germs and bonds with the skin to continue killing germs even after washing. Oral hygiene is also important in reducing the risk of infection. Remember to brush your teeth with your regular toothpaste the morning of surgery.  Please DO NOT use if you have an allergy to CHG or antibacterial soaps. If your skin becomes reddened/irritated stop using the CHG and inform your Pre-op nurse day of surgery.  DO NOT shave (including legs and  genital area) for at least 48 hours prior to your CHG shower.   Please follow these instructions carefully:  Shower with CHG soap the night before surgery. If you choose to wash your hair, wash your hair first as usual with your normal shampoo. After you shampoo, rinse your hair and body thoroughly to remove the shampoo. Use CHG as you would any other liquid soap. You can apply CHG directly to the skin and wash gently with a clean washcloth or shower sponge. Apply the CHG soap to your body ONLY FROM THE NECK DOWN. Do not use on open wounds or open sores. Avoid contact with your eyes, ears, mouth, and genitals (private parts). Wash genitals (private parts) with your normal soap. Wash thoroughly, paying special attention to the area where your surgery will be performed. Thoroughly rinse your body with warm water  from the neck down. DO NOT shower/wash with your normal soap after using and rinsing off the CHG soap. DO NOT use lotions, oils, etc., after showering with CHG. Pat yourself dry with a clean towel. Wear clean pajamas. Place clean sheets on your bed the night of your CHG shower and do not sleep with pets.  Day of Surgery  DO NOT Apply any lotions,  powder,  oils,  deodorants (may use underarm deodorant),  cologne/  perfumes  or makeup Do Not wear jewelry /  piercing's/  metal/  permanent jewelry must be removed prior to arrival day of surgery. (No plastic piercing) Do Not wear nail polish,  gel polish,  artificial nails, or any other type of covering on natural finger nails (toe nails are okay) Remember to brush your teeth and rinse mouth out. Put on clean / comfortable clothes. [facility] is not responsible for valuables/ personal belongings

## 2024-08-05 NOTE — Progress Notes (Signed)
 Spoke w/ via phone for pre-op interview--- Dana Frost needs dos---- UPT per surgeon. CBC and T&S per protocol.preop Frost appt 08/11/24 at 0930.        Frost results------ COVID test -----patient states asymptomatic no test needed Arrive at -------0815 NPO after MN NO Solid Food.  Clear liquids from MN until---0715 Pre-Surgery Ensure or G2:  Med rec completed Medications to take morning of surgery -----Lamictal  Diabetic medication -----  GLP1 agonist last dose: GLP1 instructions:  Patient instructed no nail polish to be worn day of surgery Patient instructed to bring photo id and insurance card day of surgery Patient aware to have Driver (ride ) / caregiver    for 24 hours after surgery - [name] Patient Special Instructions ----- CHG shower night before surgery. Pre-Op special Instructions -----  Patient verbalized understanding of instructions that were given at this phone interview. Patient denies chest pain, sob, fever, cough at the interview.

## 2024-08-11 ENCOUNTER — Encounter (HOSPITAL_COMMUNITY): Admission: RE | Admit: 2024-08-11 | Discharge: 2024-08-11 | Disposition: A | Source: Ambulatory Visit

## 2024-08-11 DIAGNOSIS — Z01818 Encounter for other preprocedural examination: Secondary | ICD-10-CM

## 2024-08-11 DIAGNOSIS — Z01812 Encounter for preprocedural laboratory examination: Secondary | ICD-10-CM | POA: Insufficient documentation

## 2024-08-11 LAB — CBC
HCT: 43.4 % (ref 36.0–46.0)
Hemoglobin: 14.7 g/dL (ref 12.0–15.0)
MCH: 30.6 pg (ref 26.0–34.0)
MCHC: 33.9 g/dL (ref 30.0–36.0)
MCV: 90.4 fL (ref 80.0–100.0)
Platelets: 221 K/uL (ref 150–400)
RBC: 4.8 MIL/uL (ref 3.87–5.11)
RDW: 11.5 % (ref 11.5–15.5)
WBC: 6.5 K/uL (ref 4.0–10.5)
nRBC: 0 % (ref 0.0–0.2)

## 2024-08-11 LAB — TYPE AND SCREEN
ABO/RH(D): O POS
Antibody Screen: NEGATIVE

## 2024-08-11 NOTE — H&P (Signed)
 Dana Frost is an 44 y.o. female [phone] (SVD x 2) presenting for TLH, BS, possible umbilical hernia repair for AUB-L.  Patient last seen in the office on 07/31/24 by myself. No significant change in medical or surgical history since last seen.  Denies fever, chills, N/V, CP, SOB or urinary symptoms.    Past Medical History:  Diagnosis Date   Anemia    Anxiety    Asthma    Depression    Fibromyalgia    Gallstone    GERD (gastroesophageal reflux disease)    H/O hearing loss    90%left, 10%right   Headache(784.0)    Heart murmur    as a child , outgrown per pt per MD   History of panic attacks    after death of mother   Idiopathic intracranial hypertension    Migraine    Papilledema    Pneumonia     Past Surgical History:  Procedure Laterality Date   ANKLE RECONSTRUCTION Left    x 2   LAPAROSCOPIC GASTRIC SLEEVE RESECTION N/A 10/03/2021   Procedure: LAPAROSCOPIC GASTRIC SLEEVE RESECTION;  Surgeon: Tanda Locus, MD;  Location: [facility]S;  Service: General;  Laterality: N/A;   UPPER GASTROINTESTINAL ENDOSCOPY     UPPER GI ENDOSCOPY N/A 10/03/2021   Procedure: UPPER GI ENDOSCOPY;  Surgeon: Tanda Locus, MD;  Location: [facility];  Service: General;  Laterality: N/A;    Family History  Problem Relation Age of Onset   Other Mother        Unsure of history - thinks she may have had cancer   Hypertension Father    Hyperlipidemia Father    Diabetes Father    Liver disease Father     Social History:  reports that she has quit smoking. Her smoking use included cigarettes. She has never used smokeless tobacco. She reports that she does not currently use alcohol after a past usage of about 7.0 standard drinks of alcohol per week. She reports that she does not use drugs.  Allergies: Allergies[1]   Medications Prior to Admission  Medication Sig Dispense Refill Last Dose/Taking   ASHWAGANDHA GUMMIES PO Take 300 mg by mouth. Over the counter   08/10/2024   Calcium Carbonate-Vit D-Min  (CALCIUM 1200) 1200-1000 MG-UNIT CHEW Chew by mouth. Over the counter   08/10/2024   lamoTRIgine  (LAMICTAL ) 100 MG tablet Take 100 mg by mouth daily.   08/12/2024 Morning   magnesium  gluconate (MAGONATE) 500 (27 Mg) MG TABS tablet Take 500 mg by mouth in the morning and at bedtime.   08/10/2024   Multiple Vitamins-Minerals (MULTI ADULT GUMMIES PO) Take 1 each by mouth daily.   08/10/2024    Review of Systems  Constitutional:  Negative for chills and fever.  Respiratory:  Negative for cough and shortness of breath.   Gastrointestinal:  Negative for diarrhea, nausea and vomiting.  Genitourinary:  Negative for dysuria and vaginal discharge.  Skin:  Negative for rash.  Neurological:  Negative for headaches.  Psychiatric/Behavioral:  The patient is not nervous/anxious.     Blood pressure 121/82, pulse 92, temperature 98.4 F (36.9 C), temperature source Oral, resp. rate 16, height 5' 6 (1.676 m), weight 100.2 kg, last menstrual period 07/27/2024, SpO2 97%, not currently breastfeeding.  Physical Exam Constitutional:      Appearance: Normal appearance.  HENT:     Head: Normocephalic.     Mouth/Throat:     Mouth: Mucous membranes are moist.  Eyes:     Pupils: Pupils are equal,  round, and reactive to light.  Cardiovascular:     Rate and Rhythm: Normal rate.  Pulmonary:     Effort: Pulmonary effort is normal.  Abdominal:     Palpations: Abdomen is soft.     Tenderness: There is no abdominal tenderness.  Musculoskeletal:        General: Normal range of motion.     Cervical back: Normal range of motion.  Skin:    General: Skin is warm.  Neurological:     General: No focal deficit present.     Mental Status: She is alert and oriented to person, place, and time.  Psychiatric:        Mood and Affect: Mood normal.        Behavior: Behavior normal.    Imaging:  CLINICAL DATA:  History provided by technologist  Intra-abdominal and pelvic swelling, mass and lump, unspecified site  Abnormal bleeding, pain x several mo, worse x September HX gastric sleeve SX, NKI, no HX CA   EXAM: MRI PELVIS WITHOUT AND WITH CONTRAST   TECHNIQUE: Multiplanar multisequence MR imaging of the pelvis was performed both before and after administration of intravenous contrast.   CONTRAST:  10 mL of Vueway .   COMPARISON:  Pelvic ultrasound from 04/21/2024.   FINDINGS: Urinary Tract: Limited visualization of bilateral kidneys on coronal T2 weighted images, which appears within normal limits. Urinary bladder is decompressed, which limits evaluation. However, otherwise appears within normal limits.   Bowel:  Unremarkable visualized pelvic bowel loops.   Vascular/Lymphatic: No pathologically enlarged lymph nodes. No significant vascular abnormality seen.   Reproductive: Normal-size anteverted uterus. There is a 1.7 x 2.8 cm subserosal T1 isointense and T2 hypointense enhancing lesion arising from the right paramedian upper anterior uterine body, favored to represent subserosal leiomyoma. This lesion corresponds to the lesion seen on the recent ultrasound. No other focal uterine lesions seen.   Endometrium measures up to 9-10 mm in thickness.  No focal lesion.   There are multiple nabothian cysts in the cervix. No suspicious cervical or vaginal mass seen.   There are several follicles in the right ovary with largest measuring up to 2.0 x 0.2 cm. Unremarkable left ovary. No suspicious ovarian or adnexal mass seen.   Other:  There is a tiny fat containing umbilical hernia   Musculoskeletal: No suspicious bone lesions identified.   IMPRESSION: 1. There is a 1.7 x 2.8 cm subserosal leiomyoma arising from the right paramedian upper anterior uterine body, corresponding to the lesion seen on the recent ultrasound. 2. No suspicious ovarian or adnexal mass seen. 3. No acute inflammatory process identified within the pelvis. No results found for this or any previous visit from the  past 60 days.   Results for orders placed or performed during the hospital encounter of 08/11/24 (from the past 24 hours)  Type and screen     Status: None   Collection Time: 08/11/24  9:44 AM  Result Value Ref Range   ABO/RH(D) O POS    Antibody Screen NEG    Sample Expiration 08/25/2024,2359    Extend sample reason      NO TRANSFUSIONS OR PREGNANCY IN THE PAST 3 MONTHS Performed at [facility], [address], [place], [place] [address]   CBC per protocol     Status: None   Collection Time: 08/11/24 10:00 AM  Result Value Ref Range   WBC 6.5 4.0 - 10.5 K/uL   RBC 4.80 3.87 - 5.11 MIL/uL   Hemoglobin  14.7 12.0 - 15.0 g/dL   HCT 56.5 63.9 - 53.9 %   MCV 90.4 80.0 - 100.0 fL   MCH 30.6 26.0 - 34.0 pg   MCHC 33.9 30.0 - 36.0 g/dL   RDW 88.4 88.4 - 84.4 %   Platelets 221 150 - 400 K/uL   nRBC 0.0 0.0 - 0.2 %     Assessment/Plan: 44 yo [id number] admitted for TLH, BS, cystoscopy, and possible umbilical hernia repair  Plan: - CBC and T&S ordered  - Abx: 2g Ancef  + 500 mg Flagyl  pre-procedure  - Pre-op meds: 1g Tylenol  and 200 mg pyridium  pre-procedure  - Anticiapte d/c from PACU following AVT. Discharge instructions discussed   Charmaine CHRISTELLA Oz 08/12/2024, 8:17 AM      [1]  Allergies Allergen Reactions   Penicillins Hives

## 2024-08-12 ENCOUNTER — Encounter (HOSPITAL_COMMUNITY): Payer: Self-pay

## 2024-08-12 ENCOUNTER — Ambulatory Visit (HOSPITAL_COMMUNITY): Admission: RE | Admit: 2024-08-12 | Discharge: 2024-08-12 | Disposition: A | Discharge status: Home / Self Care

## 2024-08-12 ENCOUNTER — Ambulatory Visit (HOSPITAL_COMMUNITY): Admitting: Anesthesiology

## 2024-08-12 ENCOUNTER — Encounter (HOSPITAL_COMMUNITY): Admission: RE | Disposition: A | Payer: Self-pay | Source: Home / Self Care

## 2024-08-12 DIAGNOSIS — N939 Abnormal uterine and vaginal bleeding, unspecified: Secondary | ICD-10-CM | POA: Insufficient documentation

## 2024-08-12 DIAGNOSIS — D259 Leiomyoma of uterus, unspecified: Secondary | ICD-10-CM | POA: Diagnosis not present

## 2024-08-12 DIAGNOSIS — N888 Other specified noninflammatory disorders of cervix uteri: Secondary | ICD-10-CM | POA: Diagnosis not present

## 2024-08-12 DIAGNOSIS — H9190 Unspecified hearing loss, unspecified ear: Secondary | ICD-10-CM | POA: Insufficient documentation

## 2024-08-12 DIAGNOSIS — Z87891 Personal history of nicotine dependence: Secondary | ICD-10-CM | POA: Insufficient documentation

## 2024-08-12 DIAGNOSIS — Z9884 Bariatric surgery status: Secondary | ICD-10-CM | POA: Diagnosis not present

## 2024-08-12 DIAGNOSIS — Z6835 Body mass index (BMI) 35.0-35.9, adult: Secondary | ICD-10-CM | POA: Insufficient documentation

## 2024-08-12 DIAGNOSIS — E669 Obesity, unspecified: Secondary | ICD-10-CM | POA: Diagnosis not present

## 2024-08-12 DIAGNOSIS — Z01818 Encounter for other preprocedural examination: Secondary | ICD-10-CM

## 2024-08-12 HISTORY — PX: CYSTOSCOPY: SHX5120

## 2024-08-12 HISTORY — PX: TOTAL LAPAROSCOPIC HYSTERECTOMY WITH SALPINGECTOMY: SHX6742

## 2024-08-12 LAB — POCT PREGNANCY, URINE: Preg Test, Ur: NEGATIVE

## 2024-08-12 MED ORDER — EPHEDRINE SULFATE-NACL 50-0.9 MG/10ML-% IV SOSY
PREFILLED_SYRINGE | INTRAVENOUS | Status: DC | PRN
  Administered 2024-08-12: 5 mg via INTRAVENOUS

## 2024-08-12 MED ORDER — PHENYLEPHRINE 80 MCG/ML (10ML) SYRINGE FOR IV PUSH (FOR BLOOD PRESSURE SUPPORT)
PREFILLED_SYRINGE | INTRAVENOUS | Status: DC | PRN
  Administered 2024-08-12: 100 ug via INTRAVENOUS
  Administered 2024-08-12: 80 ug via INTRAVENOUS

## 2024-08-12 MED ORDER — ONDANSETRON HCL 4 MG/2ML IJ SOLN
INTRAMUSCULAR | Status: DC | PRN
  Administered 2024-08-12: 4 mg via INTRAVENOUS

## 2024-08-12 MED ORDER — SODIUM CHLORIDE 0.9 % IV SOLN: INTRAVENOUS | Status: DC | PRN

## 2024-08-12 MED ORDER — DEXAMETHASONE SOD PHOSPHATE PF 10 MG/ML IJ SOLN
INTRAMUSCULAR | Status: AC
  Filled 2024-08-12: qty 1

## 2024-08-12 MED ORDER — PROPOFOL 10 MG/ML IV BOLUS
INTRAVENOUS | Status: DC | PRN
  Administered 2024-08-12: 150 ug via INTRAVENOUS
  Administered 2024-08-12: 50 ug via INTRAVENOUS

## 2024-08-12 MED ORDER — LACTATED RINGERS IV SOLN: INTRAVENOUS | Status: DC

## 2024-08-12 MED ORDER — SODIUM CHLORIDE 0.9 % IR SOLN
Status: DC | PRN
  Administered 2024-08-12: 1000 mL

## 2024-08-12 MED ORDER — POLYETHYLENE GLYCOL 3350 17 GM/SCOOP PO POWD: 17.0000 g | Freq: Every day | ORAL | Status: AC | PRN

## 2024-08-12 MED ORDER — LIDOCAINE 2% (20 MG/ML) 5 ML SYRINGE
INTRAMUSCULAR | Status: DC | PRN
  Administered 2024-08-12: 60 mg via INTRAVENOUS

## 2024-08-12 MED ORDER — OXYCODONE HCL 5 MG/5ML PO SOLN: 5.0000 mg | Freq: Once | ORAL | Status: DC | PRN

## 2024-08-12 MED ORDER — ROCURONIUM BROMIDE 10 MG/ML (PF) SYRINGE
PREFILLED_SYRINGE | INTRAVENOUS | Status: AC
  Filled 2024-08-12: qty 10

## 2024-08-12 MED ORDER — POVIDONE-IODINE 10 % EX SWAB: 2.0000 | Freq: Once | CUTANEOUS | Status: DC

## 2024-08-12 MED ORDER — GLYCOPYRROLATE PF 0.2 MG/ML IJ SOSY
PREFILLED_SYRINGE | INTRAMUSCULAR | Status: AC
  Filled 2024-08-12: qty 1

## 2024-08-12 MED ORDER — ACETAMINOPHEN 500 MG PO TABS: 1000.0000 mg | ORAL_TABLET | Freq: Once | ORAL | Status: DC

## 2024-08-12 MED ORDER — CHLORHEXIDINE GLUCONATE 0.12 % MT SOLN
OROMUCOSAL | Status: AC
  Filled 2024-08-12: qty 15

## 2024-08-12 MED ORDER — ONDANSETRON HCL 4 MG/2ML IJ SOLN
INTRAMUSCULAR | Status: AC
  Filled 2024-08-12: qty 2

## 2024-08-12 MED ORDER — CHLORHEXIDINE GLUCONATE 0.12 % MT SOLN
15.0000 mL | Freq: Once | OROMUCOSAL | Status: AC
  Administered 2024-08-12: 15 mL via OROMUCOSAL

## 2024-08-12 MED ORDER — EPHEDRINE 5 MG/ML INJ
INTRAVENOUS | Status: AC
  Filled 2024-08-12: qty 5

## 2024-08-12 MED ORDER — ACETAMINOPHEN 325 MG PO TABS: 650.0000 mg | ORAL_TABLET | Freq: Four times a day (QID) | ORAL | Status: AC

## 2024-08-12 MED ORDER — GLYCOPYRROLATE PF 0.2 MG/ML IJ SOSY
PREFILLED_SYRINGE | INTRAMUSCULAR | Status: DC | PRN
  Administered 2024-08-12: .2 mg via INTRAVENOUS

## 2024-08-12 MED ORDER — CEFAZOLIN SODIUM-DEXTROSE 2-4 GM/100ML-% IV SOLN
INTRAVENOUS | Status: AC
  Filled 2024-08-12: qty 100

## 2024-08-12 MED ORDER — METRONIDAZOLE 500 MG/100ML IV SOLN
500.0000 mg | INTRAVENOUS | Status: AC
  Administered 2024-08-12: 500 mg via INTRAVENOUS
  Filled 2024-08-12: qty 100

## 2024-08-12 MED ORDER — DEXMEDETOMIDINE HCL IN NACL 80 MCG/20ML IV SOLN
INTRAVENOUS | Status: DC | PRN
  Administered 2024-08-12: 12 ug via INTRAVENOUS
  Administered 2024-08-12: 6 ug via INTRAVENOUS

## 2024-08-12 MED ORDER — PHENAZOPYRIDINE HCL 200 MG PO TABS
200.0000 mg | ORAL_TABLET | ORAL | Status: AC
  Administered 2024-08-12: 200 mg via ORAL
  Filled 2024-08-12: qty 1

## 2024-08-12 MED ORDER — SODIUM CHLORIDE 0.9 % IV SOLN: 12.5000 mg | INTRAVENOUS | Status: DC | PRN

## 2024-08-12 MED ORDER — SUGAMMADEX SODIUM 200 MG/2ML IV SOLN
INTRAVENOUS | Status: DC | PRN
  Administered 2024-08-12: 200 mg via INTRAVENOUS

## 2024-08-12 MED ORDER — OXYCODONE HCL 5 MG PO TABS: 5.0000 mg | ORAL_TABLET | Freq: Once | ORAL | Status: DC | PRN

## 2024-08-12 MED ORDER — MIDAZOLAM HCL 5 MG/5ML IJ SOLN
INTRAMUSCULAR | Status: DC | PRN
  Administered 2024-08-12: 2 mg via INTRAVENOUS

## 2024-08-12 MED ORDER — SOD CITRATE-CITRIC ACID 500-334 MG/5ML PO SOLN: 30.0000 mL | ORAL | Status: DC

## 2024-08-12 MED ORDER — AMISULPRIDE (ANTIEMETIC) 5 MG/2ML IV SOLN: 10.0000 mg | Freq: Once | INTRAVENOUS | Status: DC | PRN

## 2024-08-12 MED ORDER — SODIUM CHLORIDE 0.9 % IV SOLN
INTRAVENOUS | Status: DC | PRN
  Administered 2024-08-12: 60 mL

## 2024-08-12 MED ORDER — SODIUM CHLORIDE (PF) 0.9 % IJ SOLN
INTRAMUSCULAR | Status: AC
  Filled 2024-08-12: qty 50

## 2024-08-12 MED ORDER — ONDANSETRON 4 MG PO TBDP: 4.0000 mg | ORAL_TABLET | Freq: Four times a day (QID) | ORAL | 0 refills | Status: AC | PRN

## 2024-08-12 MED ORDER — ACETAMINOPHEN 500 MG PO TABS
1000.0000 mg | ORAL_TABLET | ORAL | Status: AC
  Administered 2024-08-12: 1000 mg via ORAL

## 2024-08-12 MED ORDER — DEXAMETHASONE SOD PHOSPHATE PF 10 MG/ML IJ SOLN
INTRAMUSCULAR | Status: DC | PRN
  Administered 2024-08-12: 10 mg via INTRAVENOUS

## 2024-08-12 MED ORDER — HYDROMORPHONE HCL 1 MG/ML IJ SOLN
INTRAMUSCULAR | Status: AC
  Filled 2024-08-12: qty 0.5

## 2024-08-12 MED ORDER — FENTANYL CITRATE (PF) 100 MCG/2ML IJ SOLN
INTRAMUSCULAR | Status: DC | PRN
  Administered 2024-08-12 (×5): 50 ug via INTRAVENOUS

## 2024-08-12 MED ORDER — MIDAZOLAM HCL 2 MG/2ML IJ SOLN
INTRAMUSCULAR | Status: AC
  Filled 2024-08-12: qty 2

## 2024-08-12 MED ORDER — OXYCODONE HCL 5 MG PO TABS: 5.0000 mg | ORAL_TABLET | ORAL | 0 refills | Status: AC | PRN

## 2024-08-12 MED ORDER — ROCURONIUM 10MG/ML (10ML) SYRINGE FOR MEDFUSION PUMP - OPTIME
INTRAVENOUS | Status: DC | PRN
  Administered 2024-08-12: 40 mg via INTRAVENOUS
  Administered 2024-08-12: 30 mg via INTRAVENOUS
  Administered 2024-08-12: 60 mg via INTRAVENOUS

## 2024-08-12 MED ORDER — FENTANYL CITRATE (PF) 100 MCG/2ML IJ SOLN: 25.0000 ug | INTRAMUSCULAR | Status: DC | PRN

## 2024-08-12 MED ORDER — CEFAZOLIN SODIUM-DEXTROSE 2-4 GM/100ML-% IV SOLN
2.0000 g | INTRAVENOUS | Status: AC
  Administered 2024-08-12: 2 g via INTRAVENOUS

## 2024-08-12 MED ORDER — ROPIVACAINE HCL 5 MG/ML IJ SOLN
INTRAMUSCULAR | Status: AC
  Filled 2024-08-12: qty 30

## 2024-08-12 MED ORDER — BUPIVACAINE HCL (PF) 0.25 % IJ SOLN
INTRAMUSCULAR | Status: DC | PRN
  Administered 2024-08-12: 20 mL

## 2024-08-12 MED ORDER — HYDROMORPHONE HCL 1 MG/ML IJ SOLN
INTRAMUSCULAR | Status: DC | PRN
  Administered 2024-08-12: .5 mg via INTRAVENOUS

## 2024-08-12 MED ORDER — LIDOCAINE 2% (20 MG/ML) 5 ML SYRINGE
INTRAMUSCULAR | Status: AC
  Filled 2024-08-12: qty 5

## 2024-08-12 MED ORDER — 0.9 % SODIUM CHLORIDE (POUR BTL) OPTIME
TOPICAL | Status: DC | PRN
  Administered 2024-08-12: 1000 mL

## 2024-08-12 MED ORDER — FENTANYL CITRATE (PF) 250 MCG/5ML IJ SOLN
INTRAMUSCULAR | Status: AC
  Filled 2024-08-12: qty 5

## 2024-08-12 MED ORDER — ORAL CARE MOUTH RINSE: 15.0000 mL | Freq: Once | OROMUCOSAL | Status: AC

## 2024-08-12 MED ORDER — PHENYLEPHRINE 80 MCG/ML (10ML) SYRINGE FOR IV PUSH (FOR BLOOD PRESSURE SUPPORT)
PREFILLED_SYRINGE | INTRAVENOUS | Status: AC
  Filled 2024-08-12: qty 10

## 2024-08-12 MED ORDER — ACETAMINOPHEN 500 MG PO TABS
ORAL_TABLET | ORAL | Status: AC
  Filled 2024-08-12: qty 2

## 2024-08-12 MED ORDER — IBUPROFEN 600 MG PO TABS: 600.0000 mg | ORAL_TABLET | Freq: Four times a day (QID) | ORAL | Status: AC

## 2024-08-12 NOTE — OR Nursing (Signed)
 100cc sterile normal saline instilled into bladder via cathetor.  Patient states she is feeling full at 100cc.  Cathetor balloon deflated and removed.  Patient assisted to restroom where she voided 100cc clear light yellow urine.  Dr. Ardean notified orders received to discharge to home without cathetor.   16:50patient voids a second time.  It was not measured.  Andree Kerns rn

## 2024-08-12 NOTE — Op Note (Signed)
 "    Operative Note  PATIENT:  Dana Frost  44 y.o. female DATE: 1[birth year]  PRE-OPERATIVE DIAGNOSIS:  abnormal uterine bleeding  POST-OPERATIVE DIAGNOSIS:  abnormal uterine bleeding   PROCEDURE:  Procedures: HYSTERECTOMY, TOTAL, LAPAROSCOPIC, WITH SALPINGECTOMY (N/A) CYSTOSCOPY (N/A)   SURGEON:  Surgeons and Role:    * Macrina Lehnert, Charmaine HERO, MD - Primary    * Claire Rubie LABOR, MD - Assisting  ANESTHESIA:   general  MEDICATIONS: 2g Ancef  and 500 mg Flagyl  pre-procedure   EBL:  50 cc  UOP: 150 ccc  LOCAL MEDICATIONS USED:  10 cc 0.25 % MARCAINE   and 50 cc intra-abdominal Ropivacaine    SPECIMEN:    ID Type Source Tests Collected by Time Destination  1 : uterus, cervix, bilateral fallopian tubes Tissue PATH Gyn benign resection SURGICAL PATHOLOGY Lane Charmaine HERO, MD 08/12/2024 1147     DISPOSITION OF SPECIMEN:  PATHOLOGY  FINDINGS:  EUA: multiparous cervix, Uterus sounded to 8cm.   <0.5cm fat containing umbilical hernia, no repaired due to size.  On laparoscopic view - normal upper abdominal survey, normal appearing ovaries and fallopian tubes bilaterally. Uterus with 3 cm Figo 6 fibroid anterior fundal. Normal appearing ureters bilaterally. Normal appearing apendix.x  Cystoscopy demonstrated intact bladder and bilateral ureteral jets.    DESCRIPTION OF PROCEDURE: After consent was verified the patient was taken to the operating room.  After adequate anesthesia was achieved the patient was positioned in the dorsal lithotomy position with legs in stirrups. SCDs were in place and cycling. The patient was prepped and draped in normal sterile fashion. Ancef  2g and 500 mg Flagyl  were given for infection prophylaxis. Surgical time out was performed.   A speculum was placed in the vagina and the anterior lip of the cervix was grasped with a tenaculum. The uterus was sounded to 8cm.  A large VCare was placed in the proper fashion.  The speculum was removed and the  bladder was drained with a Foley catheter.    Attention was turned to the abdomen. An 5mm incision was made at the umbilicus. The Veress needle was inserted through the incision. Correct placement was confirmed with empty syringe. CO2 insufflation was connected and opening pressure of 7 mmHg was noted. The abdomen was insufflated with CO2 gas to a total pressure of . A  5 mm trochar was inserted into the abdominal cavity under direct visualization. Abdominal survey was conducted as noted above and confirmed no vascular or visceral trauma from entry. The patient was placed in Trendelenburg.  Two additional 5mm trochars were placed, one on each lower quadrant of the abdomen and additional in right mid abdomen. All trochars were inserted under direct visualization of the camera.     Bilateral ureters were visualized peristalsing. Using Ligasure device, the right mesosalpinx was serially ligated to free the right fallopian tube. It was unable to be removed from the abdomen through the trocar so was placed in the posterior culde-sac. The right utero-ovarian ligament was then cauterized and ligated with good hemostasis. The right  round ligament was then cauterized and ligated. The vesicouterine peritoneum was opened and the bladder was dissected off the lower uterine segment and upper vagina. The right uterine vessels were skeletonized, and then ligated and transected. The same steps as above for the right side, were performed on the left side. Left fallopian tube was able to be delivered out of the abdomen through the trocar.   Next, the cervicovaginal junction was identified with the co-ring and L  hook monopolar cautery was used to enter the vagina and incise circumferentially around the cervicovaginal junction. The uterus, cervix, and right fallopian tube was removed through the vagina and sent for pathology. Hemostasis was confirmed at the vaginal cuff. The suture was passed through the vagina and then  vaginal balloon was placed.    Vaginal cuff was closed in running fashion with 0 V-loc.     The abdomen was irrigated and suctioned. Excellent hemostasis was appreciated at all pedicles. All instruments were removed from the abdomen. 50 cc intra-abdominal Ropivacaine  was introduced through umbilical trocar. Pneumoperitoneum was released. All ports were removed and the port sites were closed with 4-0 Vicryl in a subcuticular fashion and covered with skin glue. 10cc cc 0.25% marcaine  injected at the end of the case.   The vaginal cuff was palpated and noted to be intact. Foley catheter was removed. A cystoscopy was performed which demonstrated intact bladder and active bilateral ureteral jets. Foley catheter was replaced.   Counts were correct x 2. The patient was awakened from anesthesia and taken to the recovery room in stable condition.  PATIENT DISPOSITION:  PACU - hemodynamically stable.  PLAN OF CARE: Discharge to home after PACU   Charmaine Oz, MD 06/16/2024 12:36 PM   "

## 2024-08-12 NOTE — Anesthesia Procedure Notes (Signed)
 Procedure Name: Intubation Date/Time: 08/12/2024 10:20 AM  Performed by: Denton Niels CROME, CRNAPre-anesthesia Checklist: Patient identified, Emergency Drugs available, Suction available and Patient being monitored Patient Re-evaluated:Patient Re-evaluated prior to induction Oxygen Delivery Method: Circle system utilized Preoxygenation: Pre-oxygenation with 100% oxygen Induction Type: IV induction Ventilation: Mask ventilation without difficulty Laryngoscope Size: Mac and 3 Grade View: Grade I Tube type: Oral Tube size: 7.0 mm Number of attempts: 1 Airway Equipment and Method: Stylet Placement Confirmation: ETT inserted through vocal cords under direct vision, positive ETCO2 and breath sounds checked- equal and bilateral Secured at: 20 cm Tube secured with: Tape Dental Injury: Teeth and Oropharynx as per pre-operative assessment

## 2024-08-12 NOTE — Anesthesia Postprocedure Evaluation (Signed)
"   Anesthesia Post Note  Patient: Dana Frost  Procedure(s) Performed: HYSTERECTOMY, TOTAL, LAPAROSCOPIC, WITH SALPINGECTOMY (Pelvis) CYSTOSCOPY (Bladder)     Patient location during evaluation: PACU Anesthesia Type: General Level of consciousness: awake and alert Pain management: pain level controlled Vital Signs Assessment: post-procedure vital signs reviewed and stable Respiratory status: spontaneous breathing, nonlabored ventilation and respiratory function stable Cardiovascular status: stable and blood pressure returned to baseline Anesthetic complications: no   No notable events documented.  Last Vitals:  Vitals:   08/12/24 1430 08/12/24 1441  BP: 96/67   Pulse: (!) 51 (!) 53  Resp: 16 14  Temp: 36.4 C   SpO2: 94% 94%    Last Pain:  Vitals:   08/12/24 0851  TempSrc: Oral  PainSc: 0-No pain                 Debby FORBES Like      "

## 2024-08-12 NOTE — Transfer of Care (Signed)
 Immediate Anesthesia Transfer of Care Note  Patient: Dana Frost  Procedure(s) Performed: HYSTERECTOMY, TOTAL, LAPAROSCOPIC, WITH SALPINGECTOMY (Pelvis) CYSTOSCOPY (Bladder)  Patient Location: PACU  Anesthesia Type:General  Level of Consciousness: awake, alert , oriented, and patient cooperative  Airway & Oxygen Therapy: Patient Spontanous Breathing and Patient connected to face mask oxygen  Post-op Assessment: Report given to RN and Post -op Vital signs reviewed and stable  Post vital signs: Reviewed and stable  Last Vitals:  Vitals Value Taken Time  BP 108/66 08/12/24 12:34  Temp 36.5 C 08/12/24 12:34  Pulse 67 08/12/24 12:39  Resp 10 08/12/24 12:39  SpO2 98 % 08/12/24 12:39  Vitals shown include unfiled device data.  Last Pain:  Vitals:   08/12/24 0851  TempSrc: Oral  PainSc: 0-No pain      Patients Stated Pain Goal: 4 (08/12/24 0851)  Complications: No notable events documented.

## 2024-08-12 NOTE — Anesthesia Preprocedure Evaluation (Addendum)
"                                    Anesthesia Evaluation  Patient identified by MRN, date of birth, ID band Patient awake    Reviewed: Allergy & Precautions, NPO status , Patient's Chart, lab work & pertinent test results  History of Anesthesia Complications Negative for: history of anesthetic complications  Airway Mallampati: I  TM Distance: >3 FB Neck ROM: Full    Dental  (+) Dental Advisory Given, Teeth Intact   Pulmonary asthma , former smoker   Pulmonary exam normal        Cardiovascular negative cardio ROS Normal cardiovascular exam     Neuro/Psych  Headaches PSYCHIATRIC DISORDERS Anxiety Depression     Hearing loss Idiopathic intracranial hypertension     GI/Hepatic Neg liver ROS,GERD  Controlled,, Hx sleeve gastrectomy    Endo/Other   Obesity   Renal/GU negative Renal ROS     Musculoskeletal  (+)  Fibromyalgia -  Abdominal   Peds  Hematology negative hematology ROS (+)   Anesthesia Other Findings   Reproductive/Obstetrics                              Anesthesia Physical Anesthesia Plan  ASA: 2  Anesthesia Plan: General   Post-op Pain Management: Tylenol  PO (pre-op)*   Induction: Intravenous  PONV Risk Score and Plan: 3 and Treatment may vary due to age or medical condition, Ondansetron , Dexamethasone , Midazolam  and Scopolamine  patch - Pre-op  Airway Management Planned: Oral ETT  Additional Equipment: None  Intra-op Plan:   Post-operative Plan: Extubation in OR  Informed Consent: I have reviewed the patients History and Physical, chart, labs and discussed the procedure including the risks, benefits and alternatives for the proposed anesthesia with the patient or authorized representative who has indicated his/her understanding and acceptance.     Dental advisory given  Plan Discussed with: CRNA and Anesthesiologist  Anesthesia Plan Comments:          Anesthesia Quick  Evaluation  "

## 2024-08-12 NOTE — Discharge Instructions (Signed)

## 2024-08-13 ENCOUNTER — Encounter (HOSPITAL_COMMUNITY): Payer: Self-pay

## 2024-08-18 LAB — SURGICAL PATHOLOGY
